# Patient Record
Sex: Male | Born: 1975 | Race: Black or African American | Hispanic: No | Smoking: Current every day smoker
Health system: Southern US, Community
[De-identification: ages and names within clinical notes are randomized; demographics above are authoritative.]

## PROBLEM LIST (undated history)

## (undated) DIAGNOSIS — K219 Gastro-esophageal reflux disease without esophagitis: Secondary | ICD-10-CM

## (undated) DIAGNOSIS — J40 Bronchitis, not specified as acute or chronic: Secondary | ICD-10-CM

## (undated) DIAGNOSIS — I639 Cerebral infarction, unspecified: Secondary | ICD-10-CM

## (undated) DIAGNOSIS — J45909 Unspecified asthma, uncomplicated: Secondary | ICD-10-CM

## (undated) HISTORY — DX: Gastro-esophageal reflux disease without esophagitis: K21.9

---

## 2006-01-17 ENCOUNTER — Emergency Department (HOSPITAL_COMMUNITY): Admission: EM | Admit: 2006-01-17 | Discharge: 2006-01-17 | Payer: Self-pay | Admitting: Emergency Medicine

## 2006-11-27 ENCOUNTER — Emergency Department (HOSPITAL_COMMUNITY): Admission: EM | Admit: 2006-11-27 | Discharge: 2006-11-28 | Payer: Self-pay | Admitting: *Deleted

## 2007-03-13 ENCOUNTER — Emergency Department (HOSPITAL_COMMUNITY): Admission: EM | Admit: 2007-03-13 | Discharge: 2007-03-13 | Payer: Self-pay | Admitting: Emergency Medicine

## 2010-06-22 ENCOUNTER — Emergency Department (HOSPITAL_COMMUNITY)
Admission: EM | Admit: 2010-06-22 | Discharge: 2010-06-23 | Payer: Self-pay | Source: Home / Self Care | Admitting: Emergency Medicine

## 2010-10-25 LAB — BASIC METABOLIC PANEL
BUN: 9 mg/dL (ref 6–23)
CO2: 25 mEq/L (ref 19–32)
Calcium: 9.2 mg/dL (ref 8.4–10.5)
GFR calc non Af Amer: >60 mL/min (ref >60)
Glucose, Bld: 106 mg/dL — ABNORMAL HIGH (ref 70–99)

## 2010-10-25 LAB — DIFFERENTIAL
Basophils Absolute: 0 10*3/uL (ref 0.0–0.1)
Basophils Relative: 1 % (ref 0–1)
Eosinophils Absolute: 0.6 10*3/uL (ref 0.0–0.7)
Monocytes Absolute: 0.4 10*3/uL (ref 0.1–1.0)
Monocytes Relative: 6 % (ref 3–12)
Neutro Abs: 1.7 10*3/uL (ref 1.7–7.7)
Neutrophils Relative %: 27 % — ABNORMAL LOW (ref 43–77)

## 2010-10-25 LAB — POCT CARDIAC MARKERS: Myoglobin, poc: 42.5 ng/mL (ref 12–200)

## 2010-10-25 LAB — CBC
MCH: 28.6 pg (ref 26.0–34.0)
MCHC: 32.8 g/dL (ref 30.0–36.0)
RDW: 13 % (ref 11.5–15.5)

## 2011-12-31 ENCOUNTER — Encounter (HOSPITAL_COMMUNITY): Payer: Self-pay | Admitting: Family Medicine

## 2011-12-31 ENCOUNTER — Emergency Department (HOSPITAL_COMMUNITY)
Admission: EM | Admit: 2011-12-31 | Discharge: 2011-12-31 | Disposition: A | Discharge status: Home / Self Care | Payer: BC Managed Care – PPO | Attending: Emergency Medicine | Admitting: Emergency Medicine

## 2011-12-31 DIAGNOSIS — J45901 Unspecified asthma with (acute) exacerbation: Secondary | ICD-10-CM

## 2011-12-31 DIAGNOSIS — F17200 Nicotine dependence, unspecified, uncomplicated: Secondary | ICD-10-CM | POA: Insufficient documentation

## 2011-12-31 DIAGNOSIS — R42 Dizziness and giddiness: Secondary | ICD-10-CM | POA: Insufficient documentation

## 2011-12-31 DIAGNOSIS — R0602 Shortness of breath: Secondary | ICD-10-CM | POA: Insufficient documentation

## 2011-12-31 HISTORY — DX: Bronchitis, not specified as acute or chronic: J40

## 2011-12-31 MED ORDER — PREDNISONE 20 MG PO TABS: 60.0000 mg | ORAL_TABLET | ORAL | Status: AC

## 2011-12-31 MED ORDER — ALBUTEROL SULFATE (5 MG/ML) 0.5% IN NEBU
2.5000 mg | INHALATION_SOLUTION | RESPIRATORY_TRACT | Status: AC
  Administered 2011-12-31: 2.5 mg via RESPIRATORY_TRACT
  Filled 2011-12-31: qty 0.5

## 2011-12-31 MED ORDER — PREDNISONE 20 MG PO TABS
60.0000 mg | ORAL_TABLET | ORAL | Status: AC
  Administered 2011-12-31: 60 mg via ORAL
  Filled 2011-12-31: qty 3

## 2011-12-31 MED ORDER — ALBUTEROL SULFATE HFA 108 (90 BASE) MCG/ACT IN AERS
2.0000 | INHALATION_SPRAY | Freq: Four times a day (QID) | RESPIRATORY_TRACT | Status: DC
  Administered 2011-12-31: 2 via RESPIRATORY_TRACT
  Filled 2011-12-31: qty 6.7
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
## 2011-12-31 NOTE — Discharge Instructions (Signed)

## 2011-12-31 NOTE — ED Provider Notes (Signed)
History  Scribed for Gerhard Munch, MD, the patient was seen in room STRE2/STRE2. This chart was scribed by Candelaria Stagers. The patient's care started at 1:29 PM    CSN: [id number]  Arrival date & time 12/31/11  1257   First MD Initiated Contact with Patient 12/31/11 1317      Chief Complaint  Patient presents with  . Shortness of Breath     Shortness of Breath This is a chronic problem. The current episode started yesterday. The problem occurs constantly. The problem has been gradually worsening. Pertinent negatives include no fever, leg swelling or vomiting. Risk factors include smoking. He has tried OTC cough suppressants for the symptoms. The treatment provided no relief.   Randy Campbell is a 36 y.o. male who presents to the Emergency Department complaining of SOB that started yesterday.  Pt has h/o asthma.  He has been experiencing sneezing and congestion.  He is also experiencing feelings of blacking out, and lightheadedness.  He denies confusion, fever, or disorientation.  He has taken nyquil for chest congestion and sinus medication with little relief.   Past Medical History  Diagnosis Date  . Bronchitis     History reviewed. No pertinent past surgical history.  History reviewed. No pertinent family history.  History  Substance Use Topics  . Smoking status: Current Everyday Smoker  . Smokeless tobacco: Not on file  . Alcohol Use: No      Review of Systems  Constitutional: Negative for fever and chills.  HENT: Positive for congestion and sneezing.   Respiratory: Negative for shortness of breath.   Cardiovascular: Negative for leg swelling.  Gastrointestinal: Negative for nausea and vomiting.  Neurological: Positive for light-headedness. Negative for weakness.    Allergies  Review of patient's allergies indicates no known allergies.  Home Medications   Current Outpatient Rx  Name Route Sig Dispense Refill  . DAYQUIL/NYQUIL COLD/FLU RELIEF PO Oral Take 2  capsules by mouth every 4 (four) hours as needed. For cough and cold    . PSEUDOEPHEDRINE HCL 30 MG PO TABS Oral Take 60 mg by mouth every 4 (four) hours as needed. For allergies      BP 133/81  Pulse 77  Temp(Src) 98 F (36.7 C) (Oral)  Resp 16  SpO2 96%  Physical Exam  Nursing note and vitals reviewed. Constitutional: He is oriented to person, place, and time. He appears well-developed and well-nourished. No distress.  HENT:  Head: Normocephalic and atraumatic.  Eyes: EOM are normal. Right eye exhibits no discharge. Left eye exhibits no discharge.  Neck: Neck supple. No tracheal deviation present. No thyromegaly present.  Cardiovascular: Normal rate.   Pulmonary/Chest: Effort normal. No respiratory distress. He has wheezes.  Abdominal: Soft. There is no tenderness. There is no rebound.  Musculoskeletal: Normal range of motion. He exhibits no edema.  Lymphadenopathy:    He has no cervical adenopathy.  Neurological: He is alert and oriented to person, place, and time.  Skin: Skin is warm and dry. No rash noted.  Psychiatric: He has a normal mood and affect. His behavior is normal.    ED Course  Procedures     COORDINATION OF CARE:    Labs Reviewed - No data to display No results found.   No diagnosis found.    MDM  I personally performed the services described in this documentation, which was scribed in my presence. The recorded information has been reviewed and considered.  This generally well-appearing male with history of any bronchitis or  asthma presents with ongoing cough, congestion, and exam wheezing.  Given the absence of distress the patient was stable for discharge following provision of inhaled albuterol, and initiation of a course of steroids.      Gerhard Munch, MD 12/31/11 1459

## 2012-01-07 ENCOUNTER — Encounter (HOSPITAL_COMMUNITY): Payer: Self-pay | Admitting: Family Medicine

## 2012-01-07 ENCOUNTER — Emergency Department (HOSPITAL_COMMUNITY)
Admission: EM | Admit: 2012-01-07 | Discharge: 2012-01-07 | Disposition: A | Discharge status: Home Health | Payer: BC Managed Care – PPO | Attending: Emergency Medicine | Admitting: Emergency Medicine

## 2012-01-07 ENCOUNTER — Emergency Department (HOSPITAL_COMMUNITY): Payer: BC Managed Care – PPO

## 2012-01-07 DIAGNOSIS — J189 Pneumonia, unspecified organism: Secondary | ICD-10-CM | POA: Insufficient documentation

## 2012-01-07 DIAGNOSIS — R05 Cough: Secondary | ICD-10-CM | POA: Insufficient documentation

## 2012-01-07 DIAGNOSIS — Z760 Encounter for issue of repeat prescription: Secondary | ICD-10-CM | POA: Insufficient documentation

## 2012-01-07 DIAGNOSIS — F17200 Nicotine dependence, unspecified, uncomplicated: Secondary | ICD-10-CM | POA: Insufficient documentation

## 2012-01-07 DIAGNOSIS — R062 Wheezing: Secondary | ICD-10-CM | POA: Insufficient documentation

## 2012-01-07 DIAGNOSIS — R059 Cough, unspecified: Secondary | ICD-10-CM | POA: Insufficient documentation

## 2012-01-07 MED ORDER — AZITHROMYCIN 250 MG PO TABS
500.0000 mg | ORAL_TABLET | Freq: Once | ORAL | Status: AC
  Administered 2012-01-07: 500 mg via ORAL
  Filled 2012-01-07: qty 2

## 2012-01-07 MED ORDER — ALBUTEROL SULFATE HFA 108 (90 BASE) MCG/ACT IN AERS: 2.0000 | INHALATION_SPRAY | RESPIRATORY_TRACT | Status: DC | PRN

## 2012-01-07 MED ORDER — AZITHROMYCIN 250 MG PO TABS: 250.0000 mg | ORAL_TABLET | Freq: Every day | ORAL | Status: AC

## 2012-01-07 NOTE — ED Notes (Signed)
Pt sts was here last Sunday and is out of meds. Albuterol and prednisone. sts started to feel better for a few days and then the meds ran out and not feeling well again.

## 2012-01-07 NOTE — Discharge Instructions (Signed)
You appear to have a very early pneumonia on the right side. We've given you your first dose of antibiotics here to treat this. You have been given a prescription for the remainder of the antibiotics. Start this tomorrow and take 1 pill a day until the pills are gone. Use the albuterol every 4 hours as needed for wheezing. If you have a high fever not controlled by medications, chest pain, worsening shortness of breath, or any other worrisome symptoms, please return to the emergency department. Otherwise, followup with your primary care Dr.  Sheila Oats GUIDE  Dental Problems  Patients with Medicaid: [facility] [phone] [address]                                           [phone] [address] Phone:  [phone]                                                  Phone:  [phone]  If unable to pay or uninsured, contact:  [facility] or [facility]. to become qualified for the adult dental clinic.  Chronic Pain Problems Contact [facility]  [phone] Patients need to be referred by their primary care doctor.  Insufficient Money for Medicine Contact [employer]:  call "211" or [facility] ([phone] 020-5360.  No Primary Care Doctor Call [facility]  [phone] Other agencies that provide inexpensive medical care    [facility]  [phone]    [facility]  [phone]    [facility]  [phone]    [facility]  [phone]    [facility]  [phone]    [facility]  [phone]  Psychological Services [facility]  [phone] [facility]  [phone] [facility]   [phone] (emergency services [phone])  Substance Abuse [facility]  [phone] [facility] 210-027-2726 [facility] [phone] Floyd[facility] Flock ([phone] Residential & Outpatient Substance Abuse Program   [phone]  Abuse/Neglect [facility] Child Abuse Hotline 913-627-4883 [facility] Child Abuse Hotline 276-522-5020 (After Hours)  Emergency Shelter [facility] [phone]  Maternity Homes [facility] 619-027-2184 [facility] [phone]  MRSA Hotline #:   772-448-5572    [facility] Resources  [facility]     [employer]                          [facility] Dept. [address] [place]                       [address]      [address]  Patrecia Pace  Michell Heinrich Phone:  [phone]                                   Phone:  [phone]                 Phone:  [phone]  [facility] Phone:  [phone]  [facility] Child Abuse Hotline ([phone] 419-2184 9033259954 (After Hours)  Pneumonia, Adult Pneumonia is an infection of the lungs.  CAUSES Pneumonia may be caused by bacteria or a virus. Usually, these infections are caused by breathing infectious particles into the lungs (respiratory tract). SYMPTOMS   Cough.   Fever.   Chest pain.   Increased rate of breathing.   Wheezing.   Mucus production.  DIAGNOSIS  If you have the common symptoms of pneumonia, your caregiver will typically confirm the diagnosis with a chest X-ray. The X-ray will show an abnormality in the lung (pulmonary infiltrate) if you have pneumonia. Other tests of your blood, urine, or sputum may be done to find the specific cause of your pneumonia. Your caregiver may also do tests (blood gases or pulse oximetry) to see how well your lungs are working. TREATMENT  Some forms of pneumonia may be spread to other people when you cough or sneeze. You may be asked to wear a mask before and during your exam. Pneumonia that is caused by bacteria is treated with antibiotic medicine. Pneumonia that  is caused by the influenza virus may be treated with an antiviral medicine. Most other viral infections must run their course. These infections will not respond to antibiotics.  PREVENTION A pneumococcal shot (vaccine) is available to prevent a common bacterial cause of pneumonia. This is usually suggested for:  People over 3 years old.   Patients on chemotherapy.   People with chronic lung problems, such as bronchitis or emphysema.   People with immune system problems.  If you are over 65 or have a high risk condition, you may receive the pneumococcal vaccine if you have not received it before. In some countries, a routine influenza vaccine is also recommended. This vaccine can help prevent some cases of pneumonia.You may be offered the influenza vaccine as part of your care. If you smoke, it is time to quit. You may receive instructions on how to stop smoking. Your caregiver can provide medicines and counseling to help you quit. HOME CARE INSTRUCTIONS   Cough suppressants may be used if you are losing too much rest. However, coughing protects you by clearing your lungs. You should avoid using cough suppressants if you can.   Your caregiver may have prescribed medicine if he or she thinks your pneumonia is caused by a bacteria or influenza. Finish your medicine even if you start to feel better.   Your caregiver may also prescribe an expectorant. This loosens the mucus to be coughed up.   Only take over-the-counter or prescription medicines for pain, discomfort, or fever as directed by your caregiver.   Do not smoke. Smoking is a common cause of bronchitis and can contribute to pneumonia. If you are a smoker and continue to smoke, your cough may last several weeks after your pneumonia has cleared.   A cold steam vaporizer or humidifier in your room or home may help loosen mucus.   Coughing is often worse at night. Sleeping in a semi-upright position in a recliner or using a couple  pillows under your head  will help with this.   Get rest as you feel it is needed. Your body will usually let you know when you need to rest.  SEEK IMMEDIATE MEDICAL CARE IF:   Your illness becomes worse. This is especially true if you are elderly or weakened from any other disease.   You cannot control your cough with suppressants and are losing sleep.   You begin coughing up blood.   You develop pain which is getting worse or is uncontrolled with medicines.   You have a fever.   Any of the symptoms which initially brought you in for treatment are getting worse rather than better.   You develop shortness of breath or chest pain.  MAKE SURE YOU:   Understand these instructions.   Will watch your condition.   Will get help right away if you are not doing well or get worse.  Document Released: 07/31/2005 Document Revised: 07/20/2011 Document Reviewed: 10/20/2010 [facility] Patient Information 2012 [place], [place].

## 2012-01-07 NOTE — ED Notes (Signed)
Discharge instructions reviewed with pt; verbalizes understanding.  No questions asked; No further c/o's voiced.  Pt ambulatory to lobby.  NAD noted. 

## 2012-01-07 NOTE — ED Provider Notes (Signed)
History     CSN: [id number]  Arrival date & time 01/07/12  1407   First MD Initiated Contact with Patient 01/07/12 1550      Chief Complaint  Patient presents with  . Medication Refill    (Consider location/radiation/quality/duration/timing/severity/associated sxs/prior treatment) HPI History from patient. 36 year old male who presents with complaint of medication refill. States he was seen here one week for a cough and wheezing. He was told he likely had bronchitis and was given a prescription for albuterol and prednisone. States he did not get this filled until a few days after being seen and has been taking this as prescribed since. States that his cough is not remarkably improved and he continues to to feel short of breath. Has been taking albuterol as prescribed. He denies fever or chills. States his cough has been productive in nature with clear sputum. Cough worsens at night. Denies sinus pressure or tenderness, and has not had much congestion with this. Denies nausea, vomiting, diarrhea, abdominal pain. He has not had any chest pain. He denies childhood history of asthma. He has smoked in the past, but denies current smoking.  Past Medical History  Diagnosis Date  . Bronchitis     History reviewed. No pertinent past surgical history.  History reviewed. No pertinent family history.  History  Substance Use Topics  . Smoking status: Current Everyday Smoker  . Smokeless tobacco: Not on file  . Alcohol Use: No      Review of Systems  Constitutional: Negative for fever, chills, activity change and appetite change.  HENT: Negative for congestion, sore throat, facial swelling, rhinorrhea, trouble swallowing, neck pain, neck stiffness and sinus pressure.   Eyes: Negative.   Respiratory: Positive for cough, shortness of breath and wheezing. Negative for chest tightness.   Cardiovascular: Negative for chest pain, palpitations and leg swelling.  Gastrointestinal: Negative for  nausea, vomiting and abdominal pain.  Musculoskeletal: Negative for myalgias.  Skin: Negative for color change and rash.  Neurological: Negative for dizziness, weakness and numbness.    Allergies  Review of patient's allergies indicates no known allergies.  Home Medications   Current Outpatient Rx  Name Route Sig Dispense Refill  . ALBUTEROL SULFATE HFA 108 (90 BASE) MCG/ACT IN AERS Inhalation Inhale 2 puffs into the lungs every 6 (six) hours as needed. Shortness of breath    . PREDNISONE 20 MG PO TABS Oral Take 60 mg by mouth daily. For 4 days      BP 116/70  Pulse 62  Temp(Src) 98.1 F (36.7 C) (Oral)  Resp 20  SpO2 96%  Physical Exam  Nursing note and vitals reviewed. Constitutional: He appears well-developed and well-nourished. No distress.  HENT:  Head: Normocephalic and atraumatic.  Right Ear: External ear normal.  Left Ear: External ear normal.  Mouth/Throat: Oropharynx is clear and moist. No oropharyngeal exudate.       Sinuses NT to palp  Eyes: EOM are normal. Pupils are equal, round, and reactive to light.  Neck: Normal range of motion. Neck supple.  Cardiovascular: Normal rate, regular rhythm and normal heart sounds.   Pulmonary/Chest: Effort normal and breath sounds normal. He exhibits no tenderness.       Scattered rhonchi and expiratory wheezes Reduced breath sounds at R base  Abdominal: Soft. There is no tenderness.  Musculoskeletal: Normal range of motion.  Lymphadenopathy:    He has no cervical adenopathy.  Neurological: He is alert.  Skin: Skin is warm and dry. He is not diaphoretic.  Psychiatric: He has a normal mood and affect.    ED Course  Procedures (including critical care time)  Labs Reviewed - No data to display Dg Chest 2 View  01/07/2012  *RADIOLOGY REPORT*  Clinical Data: Wheezing.  Diminished breath sounds.  Cough. Central chest pain.  Cough, congestion.  CHEST - 2 VIEW  Comparison: 06/22/2010  Findings: Heart size is normal.  There  is mild perihilar bronchitic change.  Prominence of interstitial markings is noted.  There is mild density at the right lung base which may represent early infiltrate.  There are no pleural effusions. Visualized osseous structures have a normal appearance.  IMPRESSION:  1.  Bronchitic change. 2.  Question early right lower lobe infiltrate.  Original Report Authenticated By: Patterson Hammersmith, M.D.     1. Community acquired pneumonia       MDM  Patient presents with continued wheezing and subjective shortness of breath after being seen last week and diagnosed with likely bronchitis. Has been taking medication as prescribed. His vital signs are stable; he is not hypoxic, tachypneic or tachycardic. On exam, he is noted to have reduced breath sounds at the right base. X-ray, which I personally reviewed, shows questionable right lower lobe infiltrate - CAP. Will treat with Zithromax. Patient instructed to continue albuterol, and refill given for the same. He is in the process of establishing care with a PCP and has an appointment for this Wednesday, which he was instructed to keep. Reasons to return to the emergency department sooner discussed. He verbalized understanding and agreed to plan.        Grant Fontana, [place] 01/07/12 2042

## 2012-01-10 NOTE — ED Provider Notes (Signed)
Medical screening examination/treatment/procedure(s) were performed by non-physician practitioner and as supervising physician I was immediately available for consultation/collaboration.   Lyanne Co, MD 01/10/12 [phone]

## 2012-01-31 ENCOUNTER — Telehealth: Payer: Self-pay | Admitting: Internal Medicine

## 2012-01-31 NOTE — Telephone Encounter (Signed)
This patient was seen In ER for asthma and advised to follow pulmonary. Note was sent to me because I was on call. Please give first avail to any doc new consult for asthma

## 2012-02-29 ENCOUNTER — Encounter: Payer: Self-pay | Admitting: Internal Medicine

## 2012-02-29 NOTE — Telephone Encounter (Signed)
Attempted to reach pt several times at number listed.  Unable to reach the pt.  [employer] message stated that "the wireless customer is not available".  Sent a letter to pt.  Randy Campbell

## 2012-05-02 ENCOUNTER — Emergency Department (HOSPITAL_COMMUNITY)
Admission: EM | Admit: 2012-05-02 | Discharge: 2012-05-02 | Disposition: A | Discharge status: Home / Self Care | Payer: BC Managed Care – PPO | Attending: Emergency Medicine | Admitting: Emergency Medicine

## 2012-05-02 ENCOUNTER — Emergency Department (HOSPITAL_COMMUNITY): Payer: BC Managed Care – PPO

## 2012-05-02 DIAGNOSIS — R062 Wheezing: Secondary | ICD-10-CM | POA: Insufficient documentation

## 2012-05-02 DIAGNOSIS — R059 Cough, unspecified: Secondary | ICD-10-CM | POA: Insufficient documentation

## 2012-05-02 DIAGNOSIS — R05 Cough: Secondary | ICD-10-CM | POA: Insufficient documentation

## 2012-05-02 DIAGNOSIS — F17200 Nicotine dependence, unspecified, uncomplicated: Secondary | ICD-10-CM | POA: Insufficient documentation

## 2012-05-02 MED ORDER — PREDNISONE 20 MG PO TABS
60.0000 mg | ORAL_TABLET | Freq: Once | ORAL | Status: AC
  Administered 2012-05-02: 60 mg via ORAL
  Filled 2012-05-02: qty 3

## 2012-05-02 MED ORDER — IPRATROPIUM BROMIDE 0.02 % IN SOLN
0.5000 mg | Freq: Once | RESPIRATORY_TRACT | Status: AC
  Administered 2012-05-02: 0.5 mg via RESPIRATORY_TRACT
  Filled 2012-05-02: qty 2.5

## 2012-05-02 MED ORDER — ALBUTEROL SULFATE (5 MG/ML) 0.5% IN NEBU
5.0000 mg | INHALATION_SOLUTION | Freq: Once | RESPIRATORY_TRACT | Status: AC
  Administered 2012-05-02: 5 mg via RESPIRATORY_TRACT
  Filled 2012-05-02: qty 1

## 2012-05-02 MED ORDER — ALBUTEROL SULFATE HFA 108 (90 BASE) MCG/ACT IN AERS: 2.0000 | INHALATION_SPRAY | RESPIRATORY_TRACT | Status: DC | PRN

## 2012-05-02 MED ORDER — PREDNISONE 20 MG PO TABS: 60.0000 mg | ORAL_TABLET | Freq: Every day | ORAL | Status: DC

## 2012-05-02 NOTE — ED Notes (Signed)
Cp started on Monday w/ lots of wheezing last night  Sob since Tuesday  W/ exertion has had coughing alot

## 2012-05-02 NOTE — ED Provider Notes (Signed)
History     CSN: [id number]  Arrival date & time 05/02/12  4098   First MD Initiated Contact with Patient 05/02/12 0902      Chief Complaint  Patient presents with  . Shortness of Breath    (Consider location/radiation/quality/duration/timing/severity/associated sxs/prior treatment) Patient is a 36 y.o. male presenting with shortness of breath. The history is provided by the patient.  Shortness of Breath  Associated symptoms include cough, shortness of breath and wheezing. Pertinent negatives include no chest pain and no fever.  pt notes hx bronchitis/wheezing w uri symptoms, presents w non productive cough x past few days. Episodic. No specific exacerbating or alleviating factors. w coughing spell, transient mid chest soreness, no other cp or discomfort, even w activity/exertion. No sore throat, runny nose, or other uri c/o. No fever/chills. No leg pain or swelling. Smoker. Denies hx asthma.      Past Medical History  Diagnosis Date  . Bronchitis     No past surgical history on file.  No family history on file.  History  Substance Use Topics  . Smoking status: Current Every Day Smoker  . Smokeless tobacco: Not on file  . Alcohol Use: No      Review of Systems  Constitutional: Negative for fever.  HENT: Negative for neck pain.   Eyes: Negative for redness.  Respiratory: Positive for cough, shortness of breath and wheezing.   Cardiovascular: Negative for chest pain and leg swelling.  Gastrointestinal: Negative for abdominal pain.  Genitourinary: Negative for flank pain.  Musculoskeletal: Negative for back pain.  Skin: Negative for rash.  Neurological: Negative for headaches.  Hematological: Does not bruise/bleed easily.  Psychiatric/Behavioral: Negative for confusion.    Allergies  Review of patient's allergies indicates no known allergies.  Home Medications   Current Outpatient Rx  Name Route Sig Dispense Refill  . ALBUTEROL SULFATE HFA 108 (90 BASE)  MCG/ACT IN AERS Inhalation Inhale 2 puffs into the lungs every 6 (six) hours as needed.    . CHEST CONGESTION RELIEF PO Oral Take by mouth.      BP 136/89  Pulse 59  Temp 97.9 F (36.6 C)  Resp 16  SpO2 99%  Physical Exam  Nursing note and vitals reviewed. Constitutional: He is oriented to person, place, and time. He appears well-developed and well-nourished. No distress.  HENT:  Head: Atraumatic.  Eyes: Pupils are equal, round, and reactive to light.  Neck: Neck supple. No tracheal deviation present.  Cardiovascular: Normal rate, regular rhythm, normal heart sounds and intact distal pulses.  Exam reveals no gallop and no friction rub.   No murmur heard. Pulmonary/Chest: Effort normal. No accessory muscle usage. No respiratory distress. He has wheezes. He exhibits tenderness.  Abdominal: Soft. He exhibits no distension. There is no tenderness.  Musculoskeletal: Normal range of motion. He exhibits no edema and no tenderness.  Neurological: He is alert and oriented to person, place, and time.  Skin: Skin is warm and dry.  Psychiatric: He has a normal mood and affect.    ED Course  Procedures (including critical care time)  Dg Chest 2 View  05/02/2012  *RADIOLOGY REPORT*  Clinical Data: Cough, congestion.  CHEST - 2 VIEW  Comparison: 01/07/2012, 06/22/2010  Findings: Peribronchial thickening. Mild patchy opacities within the lung bases and middle lobe, similar to prior studies dating back to 2011. No pleural effusion.  No pneumothorax.  No acute osseous finding.  IMPRESSION: Peribronchial thickening can be seen with reactive airway disease or bronchiolitis.  Mild  bibasilar opacities are nonspecific with differential including atelectasis, scarring, or pneumonia.  The appearance is similar to priors dating back to 2011, which therefore favors scarring.  Recommend radiograph follow-up when the patient is asymptomatic to have a baseline.   Original Report Authenticated By: Waneta Martins, M.D.        MDM  Albuterol and atrovent neb. pred po. Cxr.    Reviewed nursing notes and prior charts for additional history.    cxr unchanged from prior. No fevers. Occasional non productive cough in ed.  Recheck mild wheezing persists.  pred po, albuterol and atrovent neb.   No rales, no fevers. Pt improved. Good air exchange. Suspect viral uri w assoc bronchospasm/wheezing.   Smoking cessation. Brief course prednisone. Albuterol.       Suzi Roots, MD 05/02/12 [phone]

## 2012-05-02 NOTE — ED Notes (Signed)
Discharge instructions reviewed. Pt verbalized understanding.  

## 2012-05-02 NOTE — ED Notes (Signed)
Pt to xray via wc

## 2012-07-19 ENCOUNTER — Encounter (HOSPITAL_COMMUNITY): Payer: Self-pay

## 2012-07-19 ENCOUNTER — Emergency Department (HOSPITAL_COMMUNITY)
Admission: EM | Admit: 2012-07-19 | Discharge: 2012-07-20 | Disposition: A | Discharge status: Home / Self Care | Payer: BC Managed Care – PPO | Attending: Emergency Medicine | Admitting: Emergency Medicine

## 2012-07-19 ENCOUNTER — Emergency Department (HOSPITAL_COMMUNITY): Payer: BC Managed Care – PPO

## 2012-07-19 DIAGNOSIS — Z8709 Personal history of other diseases of the respiratory system: Secondary | ICD-10-CM | POA: Insufficient documentation

## 2012-07-19 DIAGNOSIS — Z79899 Other long term (current) drug therapy: Secondary | ICD-10-CM | POA: Insufficient documentation

## 2012-07-19 DIAGNOSIS — R404 Transient alteration of awareness: Secondary | ICD-10-CM | POA: Insufficient documentation

## 2012-07-19 DIAGNOSIS — F17200 Nicotine dependence, unspecified, uncomplicated: Secondary | ICD-10-CM | POA: Insufficient documentation

## 2012-07-19 DIAGNOSIS — S134XXA Sprain of ligaments of cervical spine, initial encounter: Secondary | ICD-10-CM

## 2012-07-19 DIAGNOSIS — G44309 Post-traumatic headache, unspecified, not intractable: Secondary | ICD-10-CM | POA: Insufficient documentation

## 2012-07-19 DIAGNOSIS — S139XXA Sprain of joints and ligaments of unspecified parts of neck, initial encounter: Secondary | ICD-10-CM | POA: Insufficient documentation

## 2012-07-19 DIAGNOSIS — Y92410 Unspecified street and highway as the place of occurrence of the external cause: Secondary | ICD-10-CM | POA: Insufficient documentation

## 2012-07-19 DIAGNOSIS — R51 Headache: Secondary | ICD-10-CM

## 2012-07-19 DIAGNOSIS — Y9389 Activity, other specified: Secondary | ICD-10-CM | POA: Insufficient documentation

## 2012-07-19 MED ORDER — IBUPROFEN 800 MG PO TABS
800.0000 mg | ORAL_TABLET | Freq: Once | ORAL | Status: AC
  Administered 2012-07-19: 800 mg via ORAL
  Filled 2012-07-19: qty 1

## 2012-07-19 MED ORDER — CYCLOBENZAPRINE HCL 10 MG PO TABS
10.0000 mg | ORAL_TABLET | Freq: Once | ORAL | Status: AC
  Administered 2012-07-19: 10 mg via ORAL
  Filled 2012-07-19: qty 1

## 2012-07-19 NOTE — ED Notes (Signed)
Patient transported to CT 
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
## 2012-07-20 MED ORDER — CYCLOBENZAPRINE HCL 10 MG PO TABS: 10.0000 mg | ORAL_TABLET | Freq: Three times a day (TID) | ORAL | Status: DC | PRN

## 2012-07-20 MED ORDER — IBUPROFEN 800 MG PO TABS: 800.0000 mg | ORAL_TABLET | Freq: Three times a day (TID) | ORAL | Status: DC | PRN
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
## 2013-04-26 ENCOUNTER — Encounter (HOSPITAL_COMMUNITY): Payer: Self-pay | Admitting: Emergency Medicine

## 2013-04-26 DIAGNOSIS — J9801 Acute bronchospasm: Secondary | ICD-10-CM | POA: Insufficient documentation

## 2013-04-26 DIAGNOSIS — F17200 Nicotine dependence, unspecified, uncomplicated: Secondary | ICD-10-CM | POA: Insufficient documentation

## 2013-04-26 DIAGNOSIS — J45901 Unspecified asthma with (acute) exacerbation: Secondary | ICD-10-CM | POA: Insufficient documentation

## 2013-04-26 DIAGNOSIS — J069 Acute upper respiratory infection, unspecified: Secondary | ICD-10-CM | POA: Insufficient documentation

## 2013-04-26 DIAGNOSIS — Z8673 Personal history of transient ischemic attack (TIA), and cerebral infarction without residual deficits: Secondary | ICD-10-CM | POA: Insufficient documentation

## 2013-04-26 NOTE — ED Notes (Signed)
Pt. reports SOB with occasional dry cough / chest congestion and right fingers pain onset this afternoon .

## 2013-04-27 ENCOUNTER — Emergency Department (HOSPITAL_COMMUNITY)
Admit: 2013-04-27 | Discharge: 2013-04-27 | Disposition: A | Discharge status: Home / Self Care | Payer: Self-pay | Attending: Emergency Medicine | Admitting: Emergency Medicine

## 2013-04-27 ENCOUNTER — Emergency Department (HOSPITAL_COMMUNITY)
Admission: EM | Admit: 2013-04-27 | Discharge: 2013-04-27 | Disposition: A | Discharge status: Home / Self Care | Payer: BC Managed Care – PPO | Attending: Emergency Medicine | Admitting: Emergency Medicine

## 2013-04-27 DIAGNOSIS — J069 Acute upper respiratory infection, unspecified: Secondary | ICD-10-CM

## 2013-04-27 DIAGNOSIS — J9801 Acute bronchospasm: Secondary | ICD-10-CM

## 2013-04-27 DIAGNOSIS — Z720 Tobacco use: Secondary | ICD-10-CM

## 2013-04-27 HISTORY — DX: Cerebral infarction, unspecified: I63.9

## 2013-04-27 HISTORY — DX: Unspecified asthma, uncomplicated: J45.909

## 2013-04-27 MED ORDER — ALBUTEROL SULFATE HFA 108 (90 BASE) MCG/ACT IN AERS
2.0000 | INHALATION_SPRAY | Freq: Once | RESPIRATORY_TRACT | Status: AC
  Administered 2013-04-27: 2 via RESPIRATORY_TRACT
  Filled 2013-04-27: qty 6.7

## 2013-04-27 NOTE — ED Provider Notes (Signed)
CSN: [id number]     Arrival date & time 04/26/13  2324 History   First MD Initiated Contact with Patient 04/27/13 0040     Chief Complaint  Patient presents with  . Shortness of Breath  . Cough   (Consider location/radiation/quality/duration/timing/severity/associated sxs/prior Treatment) HPI Patient is a 37 yo man who says he has seasonal bronchospasm.   He presents with complaints of cough x 1 day. He says, "it is one of those trying to clear my throat kind of coughs".  The patient says he was starting to feel like he MIGHT have nasal congestion earlier this evening so, he took at OTC allergy medication.  Patient says he feels like has been wheezing - although not currently. Denies chest pain and fever. No history of hospitalizations for pulmonary related issues. No history of CAD or cardiomyopathy.   Past Medical History  Diagnosis Date  . Bronchitis   . Asthma   . Stroke    History reviewed. No pertinent past surgical history. No family history on file. History  Substance Use Topics  . Smoking status: Current Every Day Smoker  . Smokeless tobacco: Not on file  . Alcohol Use: No    Review of Systems 10 point ROS is obtained and is negative except as noted above. Patient notes that he sometimes has trouble with reading comprehension secondary to remote CVA.   Allergies  Review of patient's allergies indicates no known allergies.  Home Medications   Current Outpatient Rx  Name  Route  Sig  Dispense  Refill  . OVER THE COUNTER MEDICATION   Oral   Take 1 tablet by mouth daily as needed (allergies). Multi-symptom allergy medication          BP 116/72  Pulse 55  Temp(Src) 98 F (36.7 C) (Oral)  Resp 20  Wt 248 lb (112.492 kg)  SpO2 98% Physical Exam Gen: well developed and well nourished appearing Head: NCAT Eyes: PERL, EOMI Nose: no epistaixis or rhinorrhea Mouth/throat: mucosa is moist and pink Neck: supple, no stridor Lungs: CTA B, no wheezing, rhonchi or  rales, respiratory rate 16-20 minutes., No accessory muscle use or retractions CV: Regular rate and rhythm, no murmur, extremities appear well perfused Abd: soft, notender, nondistended Back: no ttp, no cva ttp Skin: Warm and dry Extremities: No edema or tenderness Neuro: CN ii-xii grossly intact, no focal deficits Psyche; normal affect,  calm and cooperative.   ED Course  Procedures (including critical care time) Imaging Review Dg Chest 2 View  04/27/2013   CLINICAL DATA:  Shortness of breath, cough.  EXAM: CHEST  2 VIEW  COMPARISON:  05/02/2012  FINDINGS: The heart size and mediastinal contours are within normal limits. Both lungs are clear. The visualized skeletal structures are unremarkable.  IMPRESSION: No active cardiopulmonary disease.   Electronically Signed   By: Charlett Nose M.D.   On: 04/27/2013 00:12    MDM  Patient with what sounds like early bronchitis. He does not have any abnormalities on CXR. We have ruled out PTX, pleural effusion, pneumonia. We will tx with Albuterol MDI. First dose in ED, and cough suppressant. Patient to f/u with PCP - referral given. Advised to return to the ED for fever, worsening sx, red flag sx.     Brandt Loosen, MD 04/27/13 [phone]

## 2013-04-27 NOTE — ED Notes (Signed)
Pt states that he has been SOB since 4pm and his right arm has been feeling numb and locks up since 6pm. 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 He also states he had a stroke a year ago and has had some weakness on his right side since then.

## 2014-05-03 IMAGING — CR DG CHEST 2V
2 series · 2 of 2 positions shown · non-contrast
Comparison: 05/02/2012

CLINICAL DATA: Shortness of breath, cough.

EXAM:
CHEST  2 VIEW

[w chest pa]
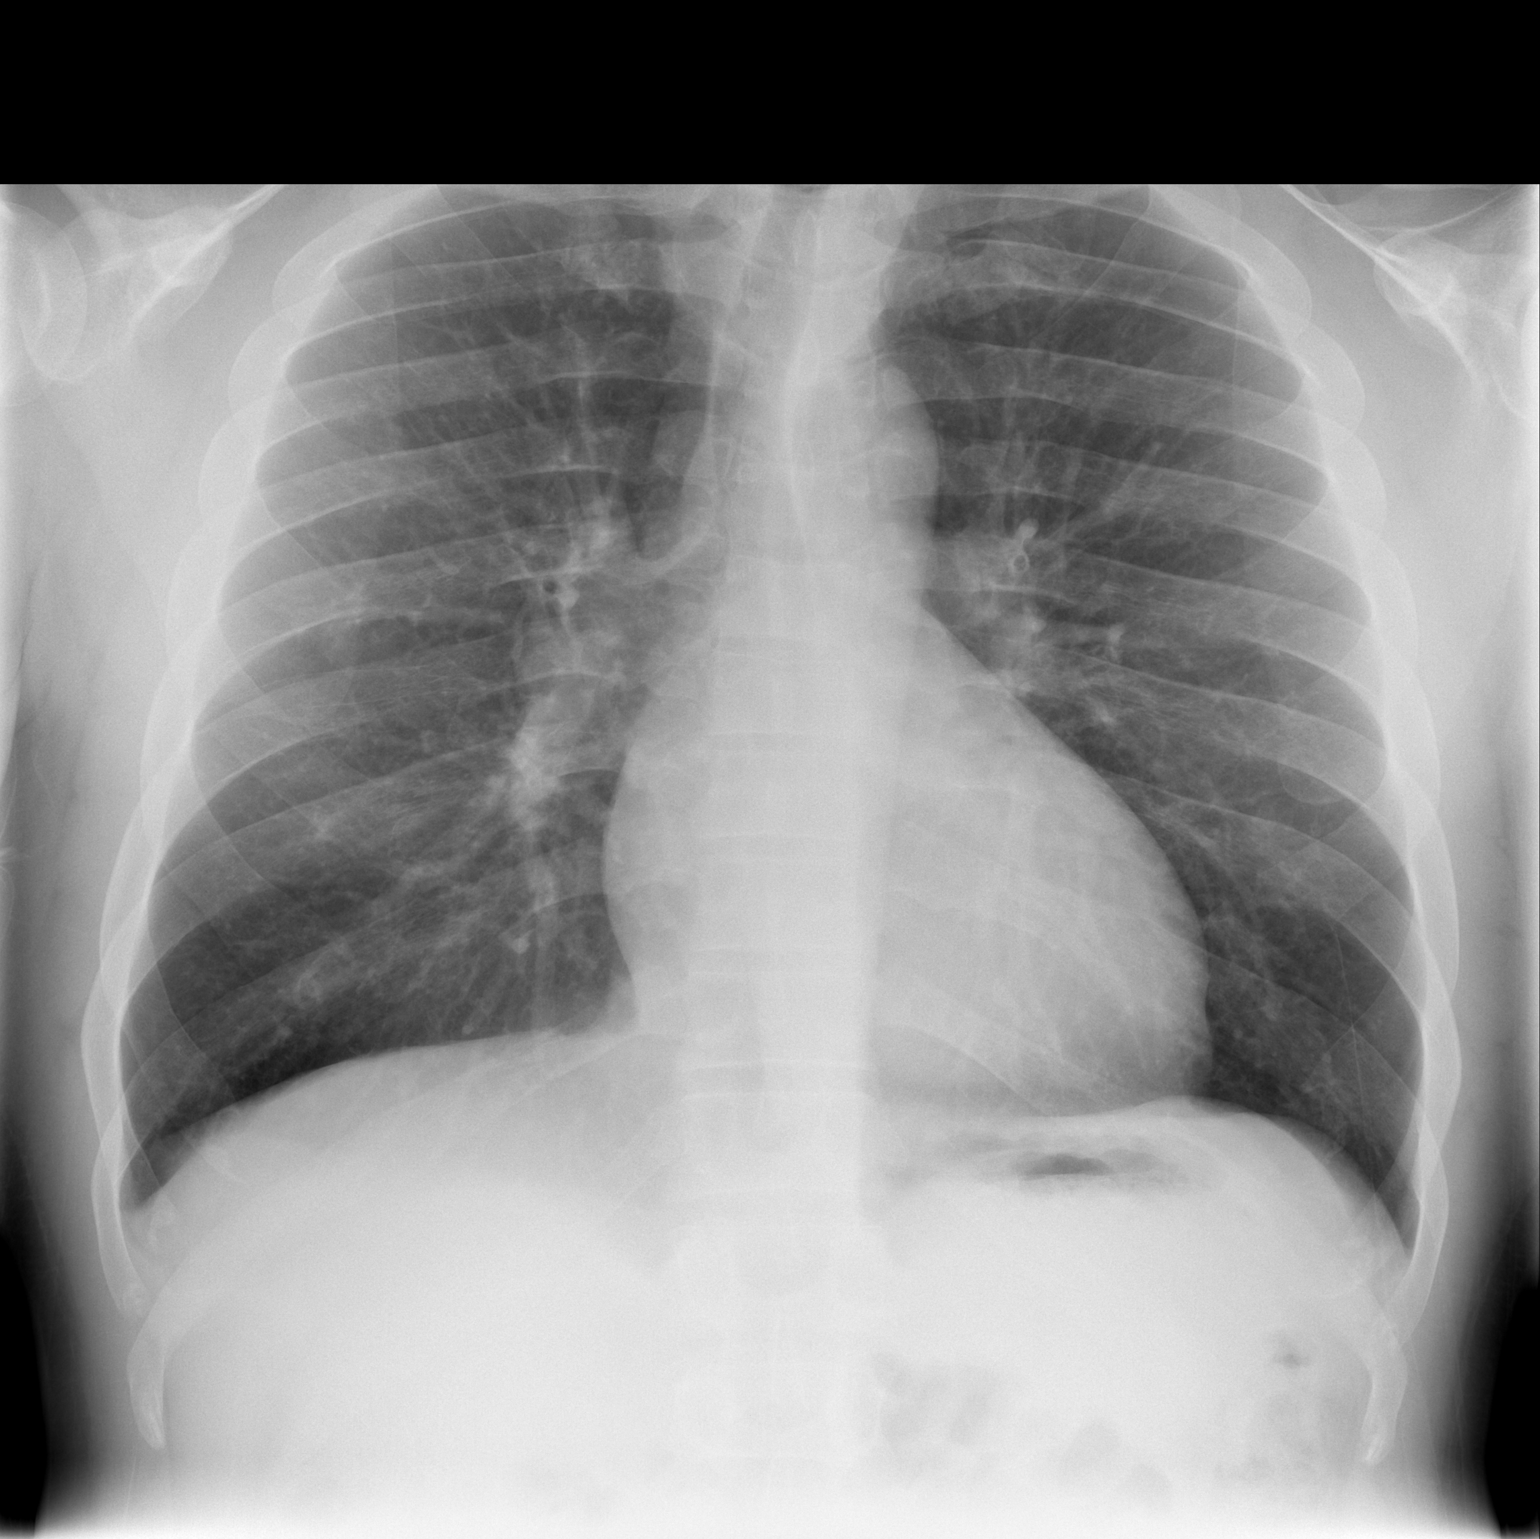

[w chest lat]
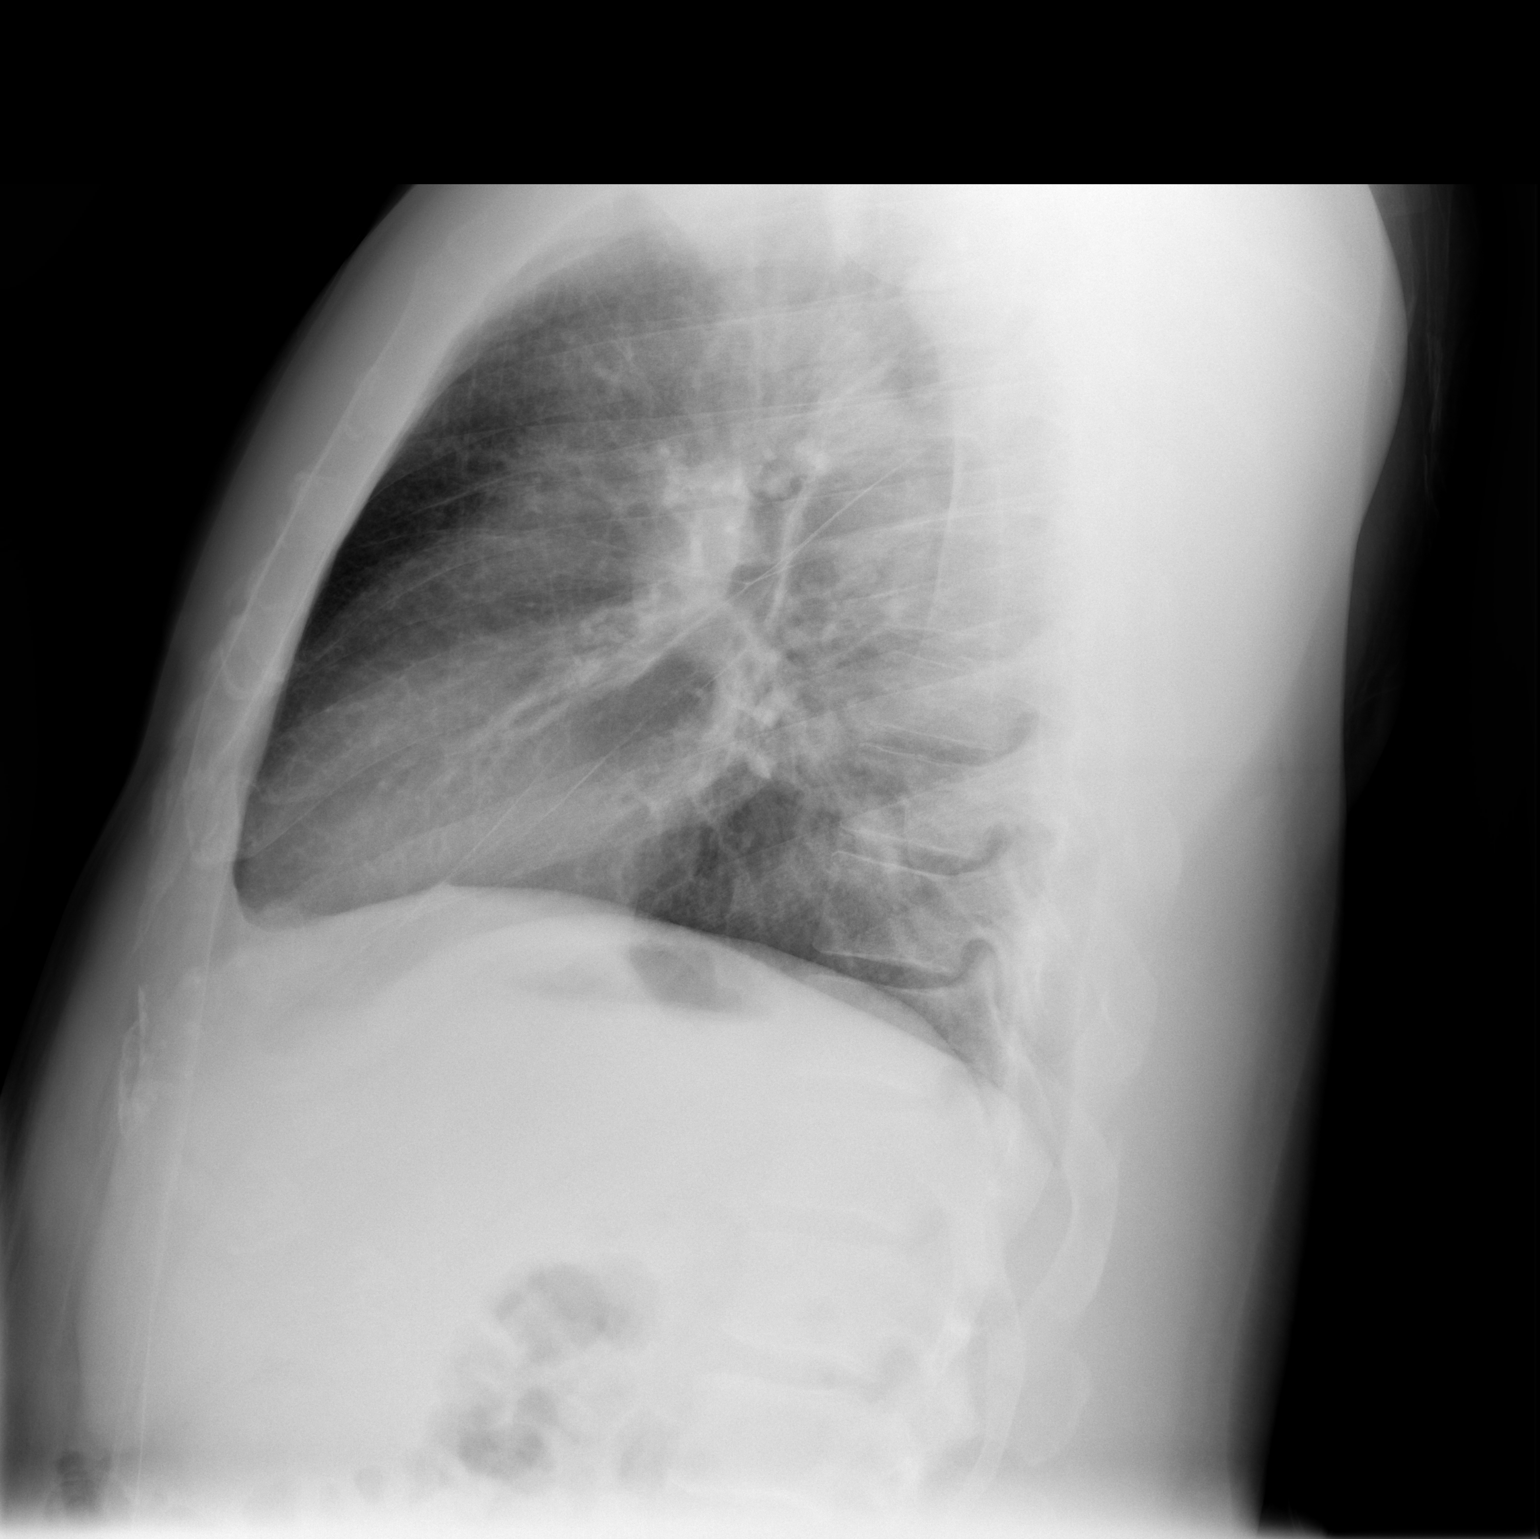

[2 of 2 positions shown; findings below may reference images not displayed]

FINDINGS: The heart size and mediastinal contours are within normal limits.
Both lungs are clear. The visualized skeletal structures are
unremarkable.
IMPRESSION: No active cardiopulmonary disease.

## 2018-06-20 ENCOUNTER — Encounter (HOSPITAL_COMMUNITY): Payer: Self-pay | Admitting: Emergency Medicine

## 2018-06-20 ENCOUNTER — Ambulatory Visit (HOSPITAL_COMMUNITY)
Admission: EM | Admit: 2018-06-20 | Discharge: 2018-06-20 | Disposition: A | Discharge status: Home / Self Care | Payer: BLUE CROSS/BLUE SHIELD | Attending: Family Medicine | Admitting: Family Medicine

## 2018-06-20 DIAGNOSIS — R1010 Upper abdominal pain, unspecified: Secondary | ICD-10-CM | POA: Diagnosis not present

## 2018-06-20 DIAGNOSIS — R1013 Epigastric pain: Secondary | ICD-10-CM

## 2018-06-20 MED ORDER — SUCRALFATE 1 G PO TABS: 1.0000 g | ORAL_TABLET | Freq: Three times a day (TID) | ORAL | 0 refills | Status: AC

## 2018-06-20 MED ORDER — ESOMEPRAZOLE MAGNESIUM 40 MG PO CPDR: 40.0000 mg | DELAYED_RELEASE_CAPSULE | Freq: Every day | ORAL | 1 refills | Status: AC

## 2018-06-20 NOTE — ED Triage Notes (Signed)
Pt c/o upper abdominal pain for several months, states eating has an effect on the pain, states sometimes drinking water helps. Was seen at [facility] before the summer and dx with gastritis.

## 2018-06-20 NOTE — ED Provider Notes (Signed)
[facility]   [id number] 06/20/18 Arrival Time: 1926  ASSESSMENT & PLAN:  1. Pain of upper abdomen   2. Dyspepsia   Possible ulcer. No need for urgent GI evaluation at this time.  Meds ordered this encounter  Medications  . esomeprazole (NEXIUM) 40 MG capsule    Sig: Take 1 capsule (40 mg total) by mouth daily.    Dispense:  30 capsule    Refill:  1  . sucralfate (CARAFATE) 1 g tablet    Sig: Take 1 tablet (1 g total) by mouth 4 (four) times daily -  with meals and at bedtime.    Dispense:  28 tablet    Refill:  0   ED if abrupt worsening. Discussed. Adhere to simple, bland diet. Initiate empiric trial of acid suppression; see orders.  Work note given.  If not seeing improvement over the next several days he may need to see GI to discuss endoscopy.  Reviewed expectations re: course of current medical issues. Questions answered. Outlined signs and symptoms indicating need for more acute intervention. Patient verbalized understanding. After Visit Summary given.   SUBJECTIVE:  Randy Campbell is a 42 y.o. male who presents with complaint of intermittent abdominal discomfort. Onset gradual, over the past few weeks. Location: upper mid abdomen without radiation. Described as burning and dull. Typically lasts several minutes to less than one hour with gradual resolution. Worse with eating. Symptoms, when present, are unchanged since beginning. Has been daily over the past few days. Does not wake him from sleep. Aggravating factors: coffee and eating. Alleviating factors: none reported. Associated symptoms: occasional belching. He denies chills, constipation, diarrhea, fever, nausea, sweats and vomiting. Appetite: decreased. PO intake: decreased. Ambulatory without assistance. Urinary symptoms: none. Last bowel movement yesterday without blood. OTC treatment: none. His wife reports that he has been on "acid medicines" in the past. None recently.  History reviewed. No pertinent  surgical history.  ROS: As per HPI. All other systems negative.  OBJECTIVE:  Vitals:   06/20/18 1937 06/20/18 1938  BP:  99/66  Pulse: 69   Resp: 16   Temp: 97.9 F (36.6 C)   SpO2: 99%     General appearance: alert; no distress Lungs: clear to auscultation bilaterally; unlabored respirations Heart: regular rate and rhythm Abdomen: soft; non-distended; mild tenderness over mid upper abdomen; bowel sounds present; no masses or organomegaly; no guarding or rebound tenderness Back: no CVA tenderness; FROM at hips Extremities: no edema; symmetrical with no gross deformities Skin: warm and dry Neurologic: normal gait Psychological: alert and cooperative; normal mood and affect   No Known Allergies                                             Past Medical History:  Diagnosis Date  . Asthma   . Bronchitis   . Stroke [facility])    Social History   Socioeconomic History  . Marital status: Married    Spouse name: Not on file  . Number of children: Not on file  . Years of education: Not on file  . Highest education level: Not on file  Occupational History  . Not on file  Social Needs  . Financial resource strain: Not on file  . Food insecurity:    Worry: Not on file    Inability: Not on file  . Transportation needs:  Medical: Not on file    Non-medical: Not on file  Tobacco Use  . Smoking status: Current Every Day Smoker  Substance and Sexual Activity  . Alcohol use: No  . Drug use: Not on file  . Sexual activity: Not on file  Lifestyle  . Physical activity:    Days per week: Not on file    Minutes per session: Not on file  . Stress: Not on file  Relationships  . Social connections:    Talks on phone: Not on file    Gets together: Not on file    Attends religious service: Not on file    Active member of club or organization: Not on file    Attends meetings of clubs or organizations: Not on file    Relationship status: Not on file  . Intimate partner violence:      Fear of current or ex partner: Not on file    Emotionally abused: Not on file    Physically abused: Not on file    Forced sexual activity: Not on file  Other Topics Concern  . Not on file  Social History Narrative  . Not on file   FH: No colon disease known.   Mardella Layman, MD 06/22/18 1008

## 2018-11-16 ENCOUNTER — Other Ambulatory Visit: Payer: Self-pay

## 2018-11-16 ENCOUNTER — Emergency Department (HOSPITAL_COMMUNITY): Payer: BLUE CROSS/BLUE SHIELD

## 2018-11-16 ENCOUNTER — Encounter (HOSPITAL_COMMUNITY): Payer: Self-pay

## 2018-11-16 ENCOUNTER — Emergency Department (HOSPITAL_COMMUNITY)
Admission: EM | Admit: 2018-11-16 | Discharge: 2018-11-17 | Disposition: A | Discharge status: Home / Self Care | Payer: BLUE CROSS/BLUE SHIELD | Attending: Emergency Medicine | Admitting: Emergency Medicine

## 2018-11-16 DIAGNOSIS — F17210 Nicotine dependence, cigarettes, uncomplicated: Secondary | ICD-10-CM | POA: Diagnosis not present

## 2018-11-16 DIAGNOSIS — R0602 Shortness of breath: Secondary | ICD-10-CM | POA: Diagnosis not present

## 2018-11-16 LAB — CBC WITH DIFFERENTIAL/PLATELET
Abs Immature Granulocytes: 0.01 10*3/uL (ref 0.00–0.07)
Basophils Absolute: 0.1 10*3/uL (ref 0.0–0.1)
Basophils Relative: 1 %
Eosinophils Absolute: 0.4 10*3/uL (ref 0.0–0.5)
Eosinophils Relative: 6 %
HCT: 39.7 % (ref 39.0–52.0)
Hemoglobin: 12.5 g/dL — ABNORMAL LOW (ref 13.0–17.0)
Immature Granulocytes: 0 %
Lymphocytes Relative: 30 %
Lymphs Abs: 2 10*3/uL (ref 0.7–4.0)
MCH: 28.6 pg (ref 26.0–34.0)
MCHC: 31.5 g/dL (ref 30.0–36.0)
MCV: 90.8 fL (ref 80.0–100.0)
Monocytes Absolute: 0.6 10*3/uL (ref 0.1–1.0)
Monocytes Relative: 8 %
Neutro Abs: 3.7 10*3/uL (ref 1.7–7.7)
Neutrophils Relative %: 55 %
Platelets: 279 10*3/uL (ref 150–400)
RBC: 4.37 MIL/uL (ref 4.22–5.81)
RDW: 13.3 % (ref 11.5–15.5)
WBC: 6.7 10*3/uL (ref 4.0–10.5)
nRBC: 0 % (ref 0.0–0.2)

## 2018-11-16 LAB — COMPREHENSIVE METABOLIC PANEL
ALT: 16 U/L (ref 0–44)
AST: 30 U/L (ref 15–41)
Albumin: 4 g/dL (ref 3.5–5.0)
Alkaline Phosphatase: 58 U/L (ref 38–126)
Anion gap: 9 (ref 5–15)
BUN: 6 mg/dL (ref 6–20)
CO2: 24 mmol/L (ref 22–32)
Calcium: 9.1 mg/dL (ref 8.9–10.3)
Chloride: 105 mmol/L (ref 98–111)
Creatinine, Ser: 0.91 mg/dL (ref 0.61–1.24)
GFR calc Af Amer: >60 mL/min (ref >60)
GFR calc non Af Amer: >60 mL/min (ref >60)
Glucose, Bld: 115 mg/dL — ABNORMAL HIGH (ref 70–99)
Potassium: 3.5 mmol/L (ref 3.5–5.1)
Sodium: 138 mmol/L (ref 135–145)
Total Bilirubin: 0.5 mg/dL (ref 0.3–1.2)
Total Protein: 6.9 g/dL (ref 6.5–8.1)

## 2018-11-16 LAB — I-STAT TROPONIN, ED: Troponin i, poc: 0.02 ng/mL (ref 0.00–0.08)

## 2018-11-16 MED ORDER — ALBUTEROL SULFATE HFA 108 (90 BASE) MCG/ACT IN AERS
4.0000 | INHALATION_SPRAY | Freq: Once | RESPIRATORY_TRACT | Status: AC
  Administered 2018-11-16: 4 via RESPIRATORY_TRACT
  Filled 2018-11-16: qty 6.7

## 2018-11-16 MED ORDER — AEROCHAMBER PLUS FLO-VU MEDIUM MISC
1.0000 | Freq: Once | Status: AC
  Administered 2018-11-16: 23:00:00 1
  Filled 2018-11-16: qty 1

## 2018-11-16 NOTE — ED Provider Notes (Signed)
[facility] EMERGENCY DEPARTMENT Provider Note   CSN: [id number] Arrival date & time: 11/16/18  2153    History   Chief Complaint Chief Complaint  Patient presents with  . Shortness of Breath  . Dizziness    HPI Randy Campbell is a 43 y.o. male with a hx of asthma, bronchitis presents to the Emergency Department complaining of gradual, persistent, progressively worsening shortness of breath onset around 4-5 PM today.  Reports his chest feels tight across the anterior portion but he does not have chest pain.  Reports that when he walks he feels lightheaded but has no room spinning, vision changes, numbness or weakness.  Patient reports that he has been able to walk without dyspnea on exertion but his shortness of breath seems worse when lying flat.  He has no history of congestive heart failure.  He denies fever, chills, cough, congestion, abdominal pain, nausea, vomiting, diarrhea, syncope, dysuria.  He denies known sick contacts.  Denies periods of immobilization or travel.  He denies leg swelling or calf pain.  He has never had a pulmonary embolism.  Patient reports a remote history of asthma however he does not regularly use an inhaler.     The history is provided by the patient and medical records.    Past Medical History:  Diagnosis Date  . Asthma   . Bronchitis   . Stroke [facility])     There are no active problems to display for this patient.   History reviewed. No pertinent surgical history.      Home Medications    Prior to Admission medications   Medication Sig Start Date End Date Taking? Authorizing Provider  esomeprazole (NEXIUM) 40 MG capsule Take 1 capsule (40 mg total) by mouth daily. Patient not taking: Reported on 11/16/2018 06/20/18   Mardella Layman, MD  sucralfate (CARAFATE) 1 g tablet Take 1 tablet (1 g total) by mouth 4 (four) times daily -  with meals and at bedtime. Patient not taking: Reported on 11/16/2018 06/20/18   Mardella Layman, MD     Family History History reviewed. No pertinent family history.  Social History Social History   Tobacco Use  . Smoking status: Current Every Day Smoker  . Smokeless tobacco: Never Used  Substance Use Topics  . Alcohol use: No  . Drug use: Yes    Types: Marijuana     Allergies   Patient has no known allergies.   Review of Systems Review of Systems  Constitutional: Negative for appetite change, diaphoresis, fatigue, fever and unexpected weight change.  HENT: Negative for mouth sores.   Eyes: Negative for visual disturbance.  Respiratory: Positive for shortness of breath. Negative for cough, chest tightness and wheezing.   Cardiovascular: Negative for chest pain.  Gastrointestinal: Negative for abdominal pain, constipation, diarrhea, nausea and vomiting.  Endocrine: Negative for polydipsia, polyphagia and polyuria.  Genitourinary: Negative for dysuria, frequency, hematuria and urgency.  Musculoskeletal: Negative for back pain and neck stiffness.  Skin: Negative for rash.  Allergic/Immunologic: Negative for immunocompromised state.  Neurological: Negative for dizziness, syncope, light-headedness and headaches.  Hematological: Does not bruise/bleed easily.  Psychiatric/Behavioral: Negative for sleep disturbance. The patient is not nervous/anxious.      Physical Exam Updated Vital Signs BP (!) 140/98 (BP Location: Right Arm)   Pulse 86   Temp 98.2 F (36.8 C) (Oral)   Resp 17   Ht 6\' 1"  (1.854 m)   Wt 106.6 kg   SpO2 98%   BMI 31.00 kg/m  Physical Exam Vitals signs and nursing note reviewed.  Constitutional:      General: He is not in acute distress.    Appearance: He is not diaphoretic.  HENT:     Head: Normocephalic.  Eyes:     General: No scleral icterus.    Conjunctiva/sclera: Conjunctivae normal.  Neck:     Musculoskeletal: Normal range of motion.  Cardiovascular:     Rate and Rhythm: Normal rate and regular rhythm.     Pulses: Normal pulses.           Radial pulses are 2+ on the right side and 2+ on the left side.  Pulmonary:     Effort: No tachypnea, accessory muscle usage, prolonged expiration, respiratory distress or retractions.     Breath sounds: No stridor. No decreased breath sounds, wheezing, rhonchi or rales.     Comments: Equal chest rise. No increased work of breathing. Chest:     Chest wall: No tenderness.  Abdominal:     General: There is no distension.     Palpations: Abdomen is soft.     Tenderness: There is no abdominal tenderness. There is no guarding or rebound.  Musculoskeletal:     Comments: Moves all extremities equally and without difficulty.  Skin:    General: Skin is warm and dry.     Capillary Refill: Capillary refill takes less than 2 seconds.  Neurological:     Mental Status: He is alert.     GCS: GCS eye subscore is 4. GCS verbal subscore is 5. GCS motor subscore is 6.     Comments: Speech is clear and goal oriented.  Psychiatric:        Mood and Affect: Mood normal.      ED Treatments / Results  Labs (all labs ordered are listed, but only abnormal results are displayed) Labs Reviewed  CBC WITH DIFFERENTIAL/PLATELET - Abnormal; Notable for the following components:      Result Value   Hemoglobin 12.5 (*)    All other components within normal limits  COMPREHENSIVE METABOLIC PANEL - Abnormal; Notable for the following components:   Glucose, Bld 115 (*)    All other components within normal limits  I-STAT TROPONIN, ED    EKG EKG Interpretation  Date/Time:  Saturday November 16 2018 22:04:26 EDT Ventricular Rate:  81 PR Interval:    QRS Duration: 107 QT Interval:  394 QTC Calculation: 458 R Axis:   57 Text Interpretation:  Sinus rhythm Multiple premature complexes, vent & supraven Prolonged PR interval Otherwise no significant change Confirmed by Melene PlanFloyd, Dan [phone]([id number]) on 11/16/2018 10:36:01 PM    EKG Interpretation  Date/Time:  Saturday November 16 2018 23:15:56 EDT Ventricular Rate:  70 PR  Interval:    QRS Duration: 86 QT Interval:  399 QTC Calculation: 431 R Axis:   64 Text Interpretation:  Sinus rhythm Borderline prolonged PR interval No significant change since last tracing Since last tracing of earlier today Confirmed by Marily MemosMesner, Jason [phone]([id number]) on 11/16/2018 11:18:09 PM        Radiology Dg Chest Port 1 View  Result Date: 11/16/2018 CLINICAL DATA:  Shortness of breath EXAM: PORTABLE CHEST 1 VIEW COMPARISON:  04/27/2013 FINDINGS: The heart size and mediastinal contours are within normal limits. Both lungs are clear. The visualized skeletal structures are unremarkable. IMPRESSION: No active disease. Electronically Signed   By: Deatra RobinsonKevin  Herman M.D.   On: 11/16/2018 23:32    Procedures Procedures (including critical care time)  Medications Ordered in ED  Medications  AeroChamber Plus Flo-Vu Medium MISC 1 each (1 each Other Given 11/16/18 2309)  albuterol (PROVENTIL HFA;VENTOLIN HFA) 108 (90 Base) MCG/ACT inhaler 4 puff (4 puffs Inhalation Given 11/16/18 2305)     Initial Impression / Assessment and Plan / ED Course  I have reviewed the triage vital signs and the nursing notes.  Pertinent labs & imaging results that were available during my care of the patient were reviewed by me and considered in my medical decision making (see chart for details).  Clinical Course as of Nov 17 3  Sat Nov 16, 2018  2245 Initial EKG with PVCs.  EKG 12-Lead [HM]  2319 Repeat EKG without acute abnormalities.  No evidence of ischemia.  EKG 12-Lead [HM]  Sun Nov 17, 2018  0001 Pt reports feeling significantly better after albuterol without persistent shortness of breath or lightheadedness.     [HM]    Clinical Course User Index [HM] Candace Begue, Dylanger, Fuentez was evaluated in Emergency Department on 11/17/2018 for the symptoms described in the history of present illness. He was evaluated in the context of the global COVID-19 pandemic, which necessitated consideration  that the patient might be at risk for infection with the SARS-CoV-2 virus that causes COVID-19. Institutional protocols and algorithms that pertain to the evaluation of patients at risk for COVID-19 are in a state of rapid change based on information released by regulatory bodies including the CDC and federal and state organizations. These policies and algorithms were followed during the patient's care in the ED.   Pt presents to the emergency department with shortness of breath.  He has a remote history of asthma, but is not currently treated.  Labs are reassuring with only mild anemia.  Troponin negative and EKG nonischemic.  Chest x-ray without evidence of pneumothorax, pulmonary edema or pneumonia.  PERC negative, doubt PE.  Patient with complete resolution of symptoms after albuterol administration.  He ambulates here in the emergency department without respiratory distress or continued feelings of lightheadedness.  States he feels well and is breathing at baseline.  Will discharge home with conservative therapies and albuterol.  Discussed reasons to return immediately to the emergency department including high fevers, worsening shortness of breath or other concerns.  BP 126/86 (BP Location: Left Arm)   Pulse 69   Temp 98.2 F (36.8 C) (Oral)   Resp (!) 21   Ht 6\' 1"  (1.854 m)   Wt 106.6 kg   SpO2 96%   BMI 31.00 kg/m    Final Clinical Impressions(s) / ED Diagnoses   Final diagnoses:  Shortness of breath    ED Discharge Orders    None       Milta Deiters 11/17/18 0005    Mesner, Barbara Cower, MD 11/17/18 [phone]

## 2018-11-16 NOTE — ED Triage Notes (Signed)
Pt reports shortness of breath around 1500 today, with dizziness, tightnesss in the chest with no pain.

## 2018-11-17 NOTE — Discharge Instructions (Addendum)
1. Medications: albuterol, usual home medications °2. Treatment: rest, drink plenty of fluids, begin OTC antihistamine (Zyrtec or Claritin)  °3. Follow Up: Please followup with your primary doctor in 2-3 days for discussion of your diagnoses and further evaluation after today's visit; if you do not have a primary care doctor use the resource guide provided to find one; Please return to the ER for difficulty breathing, high fevers or worsening symptoms. ° °

## 2019-06-26 ENCOUNTER — Other Ambulatory Visit: Payer: Self-pay

## 2019-06-26 ENCOUNTER — Encounter (HOSPITAL_COMMUNITY): Payer: Self-pay

## 2019-06-26 ENCOUNTER — Emergency Department (HOSPITAL_COMMUNITY)
Admission: EM | Admit: 2019-06-26 | Discharge: 2019-06-26 | Disposition: A | Discharge status: Home / Self Care | Payer: BC Managed Care – PPO | Attending: Emergency Medicine | Admitting: Emergency Medicine

## 2019-06-26 DIAGNOSIS — Z8673 Personal history of transient ischemic attack (TIA), and cerebral infarction without residual deficits: Secondary | ICD-10-CM | POA: Diagnosis not present

## 2019-06-26 DIAGNOSIS — J45909 Unspecified asthma, uncomplicated: Secondary | ICD-10-CM | POA: Diagnosis not present

## 2019-06-26 DIAGNOSIS — R1084 Generalized abdominal pain: Secondary | ICD-10-CM | POA: Diagnosis present

## 2019-06-26 DIAGNOSIS — F17200 Nicotine dependence, unspecified, uncomplicated: Secondary | ICD-10-CM | POA: Diagnosis not present

## 2019-06-26 DIAGNOSIS — K2900 Acute gastritis without bleeding: Secondary | ICD-10-CM

## 2019-06-26 DIAGNOSIS — K5900 Constipation, unspecified: Secondary | ICD-10-CM | POA: Diagnosis not present

## 2019-06-26 LAB — CBC
HCT: 42.8 % (ref 39.0–52.0)
Hemoglobin: 13.6 g/dL (ref 13.0–17.0)
MCH: 28.8 pg (ref 26.0–34.0)
MCHC: 31.8 g/dL (ref 30.0–36.0)
MCV: 90.7 fL (ref 80.0–100.0)
Platelets: 306 10*3/uL (ref 150–400)
RBC: 4.72 MIL/uL (ref 4.22–5.81)
RDW: 12.6 % (ref 11.5–15.5)
WBC: 4.7 10*3/uL (ref 4.0–10.5)
nRBC: 0 % (ref 0.0–0.2)

## 2019-06-26 LAB — URINALYSIS, ROUTINE W REFLEX MICROSCOPIC
Bacteria, UA: NONE SEEN
Bilirubin Urine: NEGATIVE
Glucose, UA: NEGATIVE mg/dL
Ketones, ur: 5 mg/dL — AB
Leukocytes,Ua: NEGATIVE
Nitrite: NEGATIVE
Protein, ur: NEGATIVE mg/dL
Specific Gravity, Urine: 1.019 (ref 1.005–1.030)
pH: 6 (ref 5.0–8.0)

## 2019-06-26 LAB — COMPREHENSIVE METABOLIC PANEL
ALT: 14 U/L (ref 0–44)
AST: 20 U/L (ref 15–41)
Albumin: 4.3 g/dL (ref 3.5–5.0)
Alkaline Phosphatase: 56 U/L (ref 38–126)
Anion gap: 11 (ref 5–15)
BUN: 7 mg/dL (ref 6–20)
CO2: 23 mmol/L (ref 22–32)
Calcium: 9.2 mg/dL (ref 8.9–10.3)
Chloride: 102 mmol/L (ref 98–111)
Creatinine, Ser: 0.84 mg/dL (ref 0.61–1.24)
GFR calc Af Amer: >60 mL/min (ref >60)
GFR calc non Af Amer: >60 mL/min (ref >60)
Glucose, Bld: 104 mg/dL — ABNORMAL HIGH (ref 70–99)
Potassium: 3.7 mmol/L (ref 3.5–5.1)
Sodium: 136 mmol/L (ref 135–145)
Total Bilirubin: 0.9 mg/dL (ref 0.3–1.2)
Total Protein: 7.4 g/dL (ref 6.5–8.1)

## 2019-06-26 LAB — LIPASE, BLOOD: Lipase: 34 U/L (ref 11–51)

## 2019-06-26 MED ORDER — SODIUM CHLORIDE 0.9 % IV BOLUS
1000.0000 mL | Freq: Once | INTRAVENOUS | Status: AC
Start: 2019-06-26 — End: 2019-06-26
  Administered 2019-06-26: 18:00:00 1000 mL via INTRAVENOUS

## 2019-06-26 MED ORDER — FAMOTIDINE IN NACL 20-0.9 MG/50ML-% IV SOLN
20.0000 mg | Freq: Once | INTRAVENOUS | Status: AC
  Administered 2019-06-26: 20 mg via INTRAVENOUS
  Filled 2019-06-26: qty 50

## 2019-06-26 MED ORDER — ALUMINUM-MAGNESIUM-SIMETHICONE 200-200-20 MG/5ML PO SUSP: 15.0000 mL | Freq: Three times a day (TID) | ORAL | 0 refills | Status: AC

## 2019-06-26 MED ORDER — ALUM & MAG HYDROXIDE-SIMETH 200-200-20 MG/5ML PO SUSP
30.0000 mL | Freq: Once | ORAL | Status: AC
  Administered 2019-06-26: 30 mL via ORAL
  Filled 2019-06-26: qty 30

## 2019-06-26 MED ORDER — METOCLOPRAMIDE HCL 5 MG/ML IJ SOLN
5.0000 mg | Freq: Once | INTRAMUSCULAR | Status: AC
  Administered 2019-06-26: 5 mg via INTRAVENOUS
  Filled 2019-06-26: qty 2

## 2019-06-26 MED ORDER — FAMOTIDINE 20 MG PO TABS: 20.0000 mg | ORAL_TABLET | Freq: Two times a day (BID) | ORAL | 0 refills | Status: AC

## 2019-06-26 MED ORDER — POLYETHYLENE GLYCOL 3350 17 G PO PACK: 17.0000 g | PACK | Freq: Every day | ORAL | 0 refills | Status: DC

## 2019-06-26 NOTE — ED Provider Notes (Signed)
[facility] EMERGENCY DEPARTMENT Provider Note   CSN: [id number] Arrival date & time: 06/26/19  1506     History   Chief Complaint Chief Complaint  Patient presents with  . Abdominal Pain  . Nausea    HPI Randy Campbell is a 43 y.o. male with a past medical history of asthma presenting to the ED with a chief complaint of abdominal pain and nausea.  On 06/21/2019 began having cramping abdominal pain with associated nausea.  Pain is worse with eating and drinking.  Reports constipation, no bowel movement since yesterday.  Pain improved with certain movements. He is unsure what may have triggered the symptoms as he denies any suspicious food ingestions or sick contacts with similar symptoms.  No history of similar symptoms in the past.  He has tried Gas-X and Pepto-Bismol with no improvement in his symptoms.  Denies any hematochezia, melena or hematemesis.  Denies prior abdominal surgeries.  Reports occasional alcohol use, daily marijuana use.     HPI  Past Medical History:  Diagnosis Date  . Asthma   . Bronchitis   . Stroke [facility])     There are no active problems to display for this patient.   History reviewed. No pertinent surgical history.      Home Medications    Prior to Admission medications   Medication Sig Start Date End Date Taking? Authorizing Provider  aluminum-magnesium hydroxide-simethicone (MAALOX) [phone] MG/5ML SUSP Take 15 mLs by mouth 4 (four) times daily -  before meals and at bedtime. 06/26/19   Maksim Peregoy, PA-C  esomeprazole (NEXIUM) 40 MG capsule Take 1 capsule (40 mg total) by mouth daily. Patient not taking: Reported on 11/16/2018 06/20/18   Vanessa Kick, MD  famotidine (PEPCID) 20 MG tablet Take 1 tablet (20 mg total) by mouth 2 (two) times daily. 06/26/19   Everard Interrante, PA-C  polyethylene glycol (MIRALAX / GLYCOLAX) 17 g packet Take 17 g by mouth daily. 06/26/19   Caliann Leckrone, PA-C  sucralfate (CARAFATE) 1 g tablet Take 1 tablet  (1 g total) by mouth 4 (four) times daily -  with meals and at bedtime. Patient not taking: Reported on 11/16/2018 06/20/18   Vanessa Kick, MD    Family History History reviewed. No pertinent family history.  Social History Social History   Tobacco Use  . Smoking status: Current Every Day Smoker  . Smokeless tobacco: Never Used  Substance Use Topics  . Alcohol use: No  . Drug use: Yes    Types: Marijuana     Allergies   Patient has no known allergies.   Review of Systems Review of Systems  Constitutional: Negative for appetite change, chills and fever.  HENT: Negative for ear pain, rhinorrhea, sneezing and sore throat.   Eyes: Negative for photophobia and visual disturbance.  Respiratory: Negative for cough, chest tightness, shortness of breath and wheezing.   Cardiovascular: Negative for chest pain and palpitations.  Gastrointestinal: Positive for abdominal pain, nausea and vomiting. Negative for blood in stool, constipation and diarrhea.  Genitourinary: Negative for dysuria, hematuria and urgency.  Musculoskeletal: Negative for myalgias.  Skin: Negative for rash.  Neurological: Negative for dizziness, weakness and light-headedness.     Physical Exam Updated Vital Signs BP 134/87   Pulse (!) 53   Temp 98.2 F (36.8 C) (Oral)   Resp 16   SpO2 100%   Physical Exam Vitals signs and nursing note reviewed.  Constitutional:      General: He is not in acute  distress.    Appearance: He is well-developed.  HENT:     Head: Normocephalic and atraumatic.     Nose: Nose normal.  Eyes:     General: No scleral icterus.       Left eye: No discharge.     Conjunctiva/sclera: Conjunctivae normal.  Neck:     Musculoskeletal: Normal range of motion and neck supple.  Cardiovascular:     Rate and Rhythm: Normal rate and regular rhythm.     Heart sounds: Normal heart sounds. No murmur. No friction rub. No gallop.   Pulmonary:     Effort: Pulmonary effort is normal. No  respiratory distress.     Breath sounds: Normal breath sounds.  Abdominal:     General: Bowel sounds are normal. There is no distension.     Palpations: Abdomen is soft.     Tenderness: There is generalized abdominal tenderness. There is no guarding.  Musculoskeletal: Normal range of motion.  Skin:    General: Skin is warm and dry.     Findings: No rash.  Neurological:     Mental Status: He is alert.     Motor: No abnormal muscle tone.     Coordination: Coordination normal.      ED Treatments / Results  Labs (all labs ordered are listed, but only abnormal results are displayed) Labs Reviewed  COMPREHENSIVE METABOLIC PANEL - Abnormal; Notable for the following components:      Result Value   Glucose, Bld 104 (*)    All other components within normal limits  URINALYSIS, ROUTINE W REFLEX MICROSCOPIC - Abnormal; Notable for the following components:   Hgb urine dipstick SMALL (*)    Ketones, ur 5 (*)    All other components within normal limits  LIPASE, BLOOD  CBC    EKG None  Radiology No results found.  Procedures Procedures (including critical care time)  Medications Ordered in ED Medications  sodium chloride 0.9 % bolus 1,000 mL (0 mLs Intravenous Stopped 06/26/19 2119)  famotidine (PEPCID) IVPB 20 mg premix (0 mg Intravenous Stopped 06/26/19 1900)  metoCLOPramide (REGLAN) injection 5 mg (5 mg Intravenous Given 06/26/19 1831)  alum & mag hydroxide-simeth (MAALOX/MYLANTA) 200-200-20 MG/5ML suspension 30 mL (30 mLs Oral Given 06/26/19 1831)     Initial Impression / Assessment and Plan / ED Course  I have reviewed the triage vital signs and the nursing notes.  Pertinent labs & imaging results that were available during my care of the patient were reviewed by me and considered in my medical decision making (see chart for details).        43yo M with a past medical history of asthma and GERD presented to the ED with a chief complaint of abdominal pain and  nausea.  5 days ago began having cramping abdominal pain with associated nausea.  Reports constipation, no bowel movement since yesterday.  Reports generalized abdominal pain without focal tenderness.  On exam patient is overall well-appearing.  Abdomen is generally tender without rebound or guarding focally.  Vital signs are reassuring.  Lab work including CBC, CMP, lipase unremarkable.  Urinalysis unremarkable.  Patient was given IV fluids, Reglan, Pepcid and Maalox with resolution of his symptoms.  Repeat abdominal exams are benign.  He was able to tolerate p.o. intake without difficulty.  Suspect that his symptoms are due to gastritis.  Doubt cholecystitis, appendicitis or other surgical emergent cause of symptoms.  He is comfortable with discharge home, follow-up with PCP.  Patient is hemodynamically stable,  in NAD, and able to ambulate in the ED. Evaluation does not show pathology that would require ongoing emergent intervention or inpatient treatment. I explained the diagnosis to the patient. Pain has been managed and has no complaints prior to discharge. Patient is comfortable with above plan and is stable for discharge at this time. All questions were answered prior to disposition. Strict return precautions for returning to the ED were discussed. Encouraged follow up with PCP.   An After Visit Summary was printed and given to the patient.   Portions of this note were generated with Scientist, clinical (histocompatibility and immunogenetics)Dragon dictation software. Dictation errors may occur despite best attempts at proofreading.   Final Clinical Impressions(s) / ED Diagnoses   Final diagnoses:  Acute gastritis without hemorrhage, unspecified gastritis type  Constipation, unspecified constipation type    ED Discharge Orders         Ordered    famotidine (PEPCID) 20 MG tablet  2 times daily     06/26/19 2129    aluminum-magnesium hydroxide-simethicone (MAALOX) 200-200-20 MG/5ML SUSP  3 times daily before meals & bedtime     06/26/19 2129     polyethylene glycol (MIRALAX / GLYCOLAX) 17 g packet  Daily     06/26/19 2133           Dietrich PatesKhatri, Darnella Zeiter, PA-C 06/26/19 2134    Gerhard MunchLockwood, Elio, MD 06/26/19 2353

## 2019-06-26 NOTE — ED Triage Notes (Signed)
Pt reports that for 3 days now every time he eats his stomach cramps extremely bad. Pt reports he has not had a bowel movement since Tuesday. Pt reports N/V.

## 2019-06-26 NOTE — Discharge Instructions (Signed)
Take the medications as prescribed. Follow-up with your primary care provider. Return to the ED if you start to have worsening symptoms, develop a fever, blood in your stool, vomiting up blood, chest pain or shortness of breath.

## 2019-11-22 IMAGING — DX PORTABLE CHEST - 1 VIEW
1 series · 1 of 1 positions shown · non-contrast
Comparison: 04/27/2013

CLINICAL DATA: Shortness of breath

EXAM:
PORTABLE CHEST 1 VIEW

[chest ap]
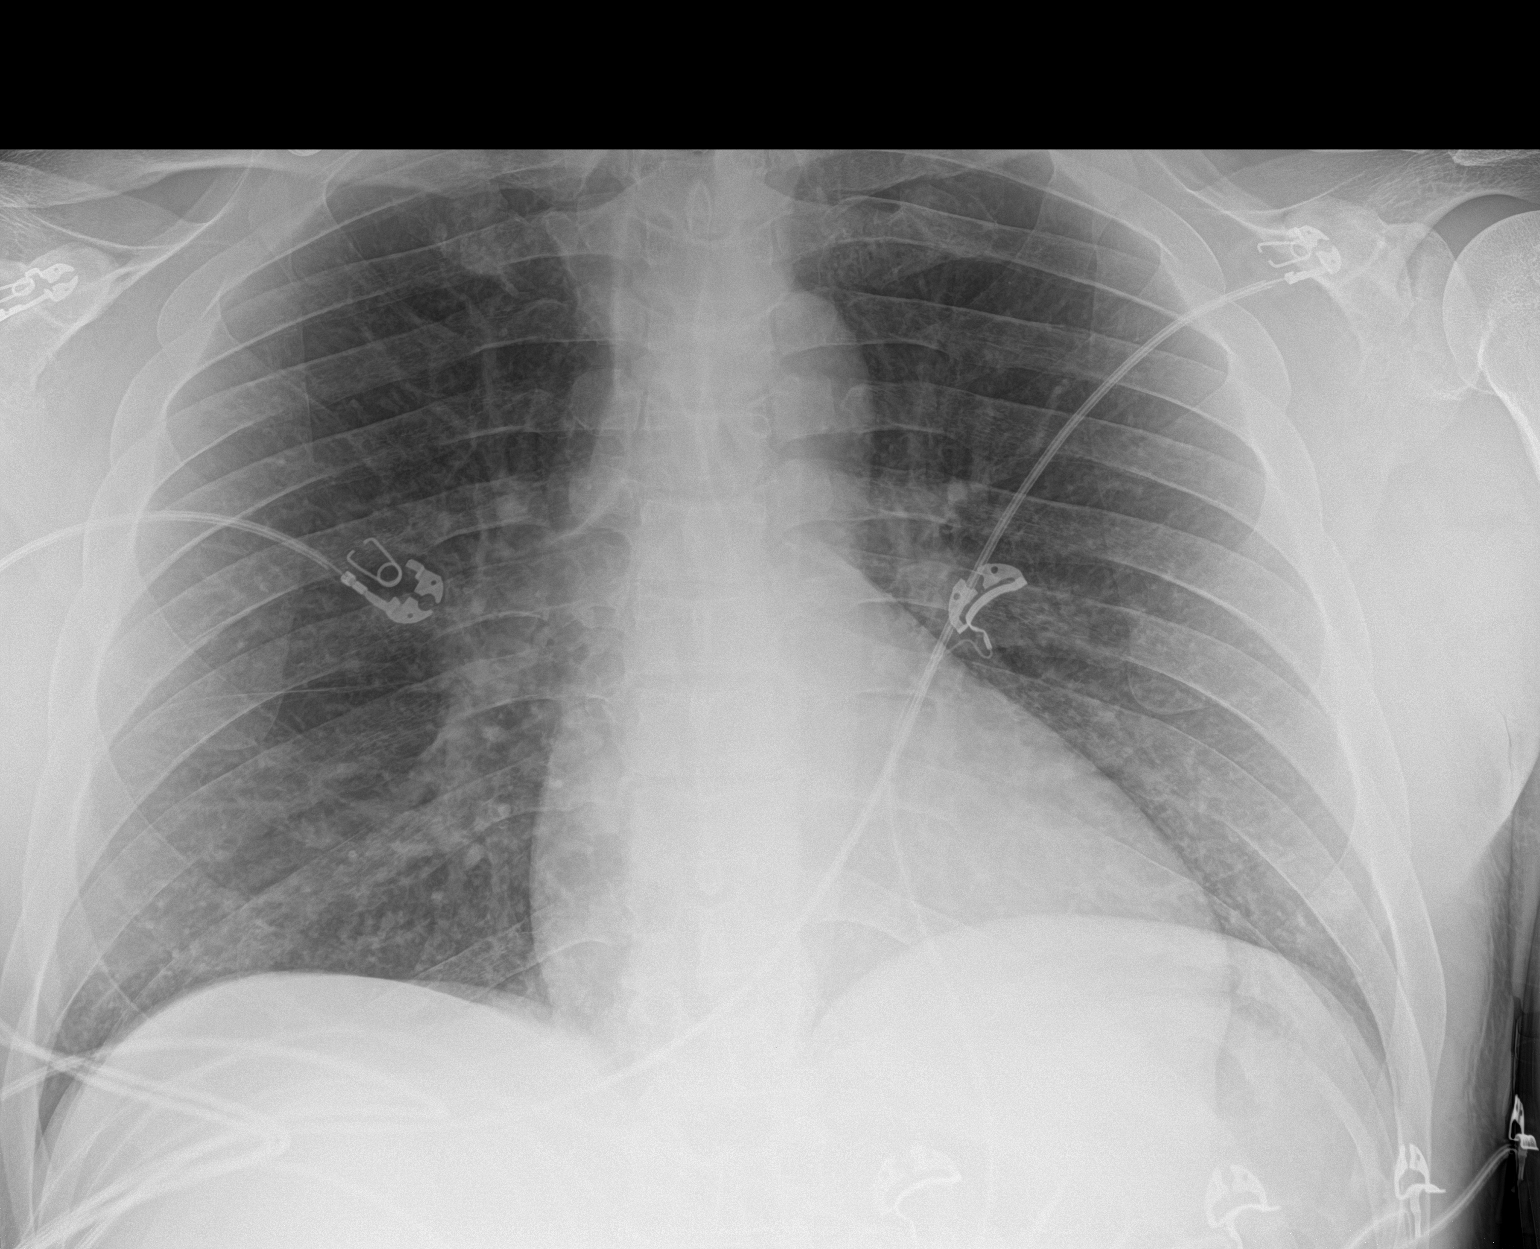

[1 of 1 positions shown; findings below may reference images not displayed]

FINDINGS: The heart size and mediastinal contours are within normal limits.
Both lungs are clear. The visualized skeletal structures are
unremarkable.
IMPRESSION: No active disease.

## 2020-06-28 ENCOUNTER — Encounter (HOSPITAL_COMMUNITY): Payer: Self-pay | Admitting: Emergency Medicine

## 2020-06-28 ENCOUNTER — Emergency Department (HOSPITAL_COMMUNITY)
Admission: EM | Admit: 2020-06-28 | Discharge: 2020-06-28 | Disposition: A | Discharge status: Home / Self Care | Payer: BC Managed Care – PPO | Attending: Emergency Medicine | Admitting: Emergency Medicine

## 2020-06-28 DIAGNOSIS — Z5321 Procedure and treatment not carried out due to patient leaving prior to being seen by health care provider: Secondary | ICD-10-CM | POA: Insufficient documentation

## 2020-06-28 DIAGNOSIS — H538 Other visual disturbances: Secondary | ICD-10-CM | POA: Insufficient documentation

## 2020-06-28 DIAGNOSIS — R200 Anesthesia of skin: Secondary | ICD-10-CM | POA: Insufficient documentation

## 2020-06-28 DIAGNOSIS — R519 Headache, unspecified: Secondary | ICD-10-CM | POA: Insufficient documentation

## 2020-06-28 LAB — COMPREHENSIVE METABOLIC PANEL
ALT: 23 U/L (ref 0–44)
AST: 25 U/L (ref 15–41)
Albumin: 4 g/dL (ref 3.5–5.0)
Alkaline Phosphatase: 49 U/L (ref 38–126)
Anion gap: 10 (ref 5–15)
BUN: 8 mg/dL (ref 6–20)
CO2: 23 mmol/L (ref 22–32)
Calcium: 8.8 mg/dL — ABNORMAL LOW (ref 8.9–10.3)
Chloride: 105 mmol/L (ref 98–111)
Creatinine, Ser: 0.93 mg/dL (ref 0.61–1.24)
GFR, Estimated: >60 mL/min (ref >60)
Glucose, Bld: 106 mg/dL — ABNORMAL HIGH (ref 70–99)
Potassium: 3.9 mmol/L (ref 3.5–5.1)
Sodium: 138 mmol/L (ref 135–145)
Total Bilirubin: 0.7 mg/dL (ref 0.3–1.2)
Total Protein: 6.9 g/dL (ref 6.5–8.1)

## 2020-06-28 LAB — CBC WITH DIFFERENTIAL/PLATELET
Abs Immature Granulocytes: 0.01 10*3/uL (ref 0.00–0.07)
Basophils Absolute: 0.1 10*3/uL (ref 0.0–0.1)
Basophils Relative: 1 %
Eosinophils Absolute: 1 10*3/uL — ABNORMAL HIGH (ref 0.0–0.5)
Eosinophils Relative: 16 %
HCT: 41 % (ref 39.0–52.0)
Hemoglobin: 13 g/dL (ref 13.0–17.0)
Immature Granulocytes: 0 %
Lymphocytes Relative: 46 %
Lymphs Abs: 2.8 10*3/uL (ref 0.7–4.0)
MCH: 28.8 pg (ref 26.0–34.0)
MCHC: 31.7 g/dL (ref 30.0–36.0)
MCV: 90.7 fL (ref 80.0–100.0)
Monocytes Absolute: 0.5 10*3/uL (ref 0.1–1.0)
Monocytes Relative: 9 %
Neutro Abs: 1.7 10*3/uL (ref 1.7–7.7)
Neutrophils Relative %: 28 %
Platelets: 364 10*3/uL (ref 150–400)
RBC: 4.52 MIL/uL (ref 4.22–5.81)
RDW: 13.2 % (ref 11.5–15.5)
WBC: 6.2 10*3/uL (ref 4.0–10.5)
nRBC: 0 % (ref 0.0–0.2)

## 2020-06-28 NOTE — ED Notes (Signed)
Called pt for vitals no answer 

## 2020-06-28 NOTE — ED Notes (Signed)
Called pt for vitals no answer X3 

## 2020-06-28 NOTE — ED Notes (Signed)
Called 2x for vitals without response

## 2020-06-28 NOTE — ED Triage Notes (Signed)
Pt reports sharp pains in his head and R sided blurred vision, reports that his bp has been running high. States that symptoms began last week. Also endorses bilateral hand numbness off and on, a/ox4, speech clear, face symmetrical, moves all limbs equally.

## 2020-12-29 ENCOUNTER — Ambulatory Visit (AMBULATORY_SURGERY_CENTER): Payer: Self-pay

## 2020-12-29 ENCOUNTER — Other Ambulatory Visit: Payer: Self-pay

## 2020-12-29 VITALS — Ht 71.0 in | Wt 240.0 lb

## 2020-12-29 DIAGNOSIS — Z1211 Encounter for screening for malignant neoplasm of colon: Secondary | ICD-10-CM

## 2020-12-29 NOTE — Progress Notes (Signed)
No allergies to soy or egg Pt is not on blood thinners or diet pills Pt Denies issues with sedation/intubation--has never had any surgeries or procedures. Denies atrial flutter/fib Denies constipation   Pt is aware of Covid safety and care partner requirements.

## 2021-01-05 ENCOUNTER — Encounter: Payer: Self-pay | Admitting: Internal Medicine

## 2021-01-11 ENCOUNTER — Encounter: Payer: Self-pay | Admitting: Certified Registered Nurse Anesthetist

## 2021-01-12 ENCOUNTER — Encounter: Payer: Self-pay | Admitting: Internal Medicine

## 2021-01-12 ENCOUNTER — Other Ambulatory Visit: Payer: Self-pay

## 2021-01-12 ENCOUNTER — Ambulatory Visit (AMBULATORY_SURGERY_CENTER): Payer: BC Managed Care – PPO | Admitting: Internal Medicine

## 2021-01-12 VITALS — BP 126/91 | HR 72 | Temp 97.8°F | Resp 32 | Ht 71.0 in | Wt 240.0 lb

## 2021-01-12 DIAGNOSIS — Z1211 Encounter for screening for malignant neoplasm of colon: Secondary | ICD-10-CM | POA: Diagnosis present

## 2021-01-12 MED ORDER — SODIUM CHLORIDE 0.9 % IV SOLN: 500.0000 mL | Freq: Once | INTRAVENOUS | Status: DC

## 2021-01-12 NOTE — Progress Notes (Signed)
Pt's states no medical or surgical changes since previsit or office visit. 

## 2021-01-12 NOTE — Progress Notes (Signed)
C.W. VITAL SIGNS. 

## 2021-01-12 NOTE — Op Note (Signed)
[facility] Patient Name: Randy Campbell Procedure Date: 01/12/2021 9:41 AM MRN: [id number] Endoscopist: Iva Boop , MD Age: 45 Referring MD:  Date of Birth: 23-Jun-1976 Gender: Male Account #: [id number] Procedure:                Colonoscopy Indications:              Screening for colorectal malignant neoplasm, This                            is the patient's first colonoscopy Medicines:                Propofol per Anesthesia, Monitored Anesthesia Care Procedure:                Pre-Anesthesia Assessment:                           - Prior to the procedure, a History and Physical                            was performed, and patient medications and                            allergies were reviewed. The patient's tolerance of                            previous anesthesia was also reviewed. The risks                            and benefits of the procedure and the sedation                            options and risks were discussed with the patient.                            All questions were answered, and informed consent                            was obtained. Prior Anticoagulants: The patient has                            taken no previous anticoagulant or antiplatelet                            agents. ASA Grade Assessment: III - A patient with                            severe systemic disease. After reviewing the risks                            and benefits, the patient was deemed in                            satisfactory condition to undergo the procedure.  After obtaining informed consent, the colonoscope                            was passed under direct vision. Throughout the                            procedure, the patient's blood pressure, pulse, and                            oxygen saturations were monitored continuously. The                            Olympus CF-HQ190 [id number]) Colonoscope was                             introduced through the anus and advanced to the the                            cecum, identified by appendiceal orifice and                            ileocecal valve. The colonoscopy was somewhat                            difficult due to significant looping. Successful                            completion of the procedure was aided by applying                            abdominal pressure. The patient tolerated the                            procedure well. The quality of the bowel                            preparation was good. The ileocecal valve,                            appendiceal orifice, and rectum were photographed.                            The bowel preparation used was Miralax via split                            dose instruction. Scope In: 9:55:41 AM Scope Out: 10:08:14 AM Scope Withdrawal Time: 0 hours 8 minutes 58 seconds  Total Procedure Duration: 0 hours 12 minutes 33 seconds  Findings:                 The perianal and digital rectal examinations were                            normal. Pertinent negatives include normal prostate                            (  size, shape, and consistency).                           External and internal hemorrhoids were found. The                            hemorrhoids were small.                           The exam was otherwise without abnormality on                            direct and retroflexion views. Complications:            No immediate complications. Estimated Blood Loss:     Estimated blood loss: none. Impression:               - External and internal hemorrhoids.                           - The examination was otherwise normal on direct                            and retroflexion views.                           - No specimens collected. Recommendation:           - Patient has a contact number available for                            emergencies. The signs and symptoms of potential                            delayed  complications were discussed with the                            patient. Return to normal activities tomorrow.                            Written discharge instructions were provided to the                            patient.                           - Resume previous diet.                           - Continue present medications.                           - Repeat colonoscopy in 10 years for screening                            purposes. Iva Boop, MD 01/12/2021 10:14:24 AM This report has been signed electronically.

## 2021-01-12 NOTE — Progress Notes (Signed)
Report given to PACU, vss 

## 2021-01-12 NOTE — Patient Instructions (Addendum)
No polyps or cancer seen.  Next routine colonoscopy or other screening test in 10 years - 2032.  I appreciate the opportunity to care for you. Iva Boop, MD, [facility] Resume previous diet Continue current medications Repeat colonoscopy in 10 years  YOU HAD AN ENDOSCOPIC PROCEDURE TODAY AT THE [facility]:   Refer to the procedure report that was given to you for any specific questions about what was found during the examination.  If the procedure report does not answer your questions, please call your gastroenterologist to clarify.  If you requested that your care partner not be given the details of your procedure findings, then the procedure report has been included in a sealed envelope for you to review at your convenience later.  YOU SHOULD EXPECT: Some feelings of bloating in the abdomen. Passage of more gas than usual.  Walking can help get rid of the air that was put into your GI tract during the procedure and reduce the bloating. If you had a lower endoscopy (such as a colonoscopy or flexible sigmoidoscopy) you may notice spotting of blood in your stool or on the toilet paper. If you underwent a bowel prep for your procedure, you may not have a normal bowel movement for a few days.  Please Note:  You might notice some irritation and congestion in your nose or some drainage.  This is from the oxygen used during your procedure.  There is no need for concern and it should clear up in a day or so.  SYMPTOMS TO REPORT IMMEDIATELY:   Following lower endoscopy (colonoscopy or flexible sigmoidoscopy):  Excessive amounts of blood in the stool  Significant tenderness or worsening of abdominal pains  Swelling of the abdomen that is new, acute  Fever of 100F or higher For urgent or emergent issues, a gastroenterologist can be reached at any hour by calling [phone]. Do not use MyChart messaging for urgent concerns.   DIET:  We do recommend a small meal at first, but then  you may proceed to your regular diet.  Drink plenty of fluids but you should avoid alcoholic beverages for 24 hours.  ACTIVITY:  You should plan to take it easy for the rest of today and you should NOT DRIVE or use heavy machinery until tomorrow (because of the sedation medicines used during the test).    FOLLOW UP: Our staff will call the number listed on your records 48-72 hours following your procedure to check on you and address any questions or concerns that you may have regarding the information given to you following your procedure. If we do not reach you, we will leave a message.  We will attempt to reach you two times.  During this call, we will ask if you have developed any symptoms of COVID 19. If you develop any symptoms (ie: fever, flu-like symptoms, shortness of breath, cough etc.) before then, please call [phone].  If you test positive for Covid 19 in the 2 weeks post procedure, please call and report this information to [place].    If any biopsies were taken you will be contacted by phone or by letter within the next 1-3 weeks.  Please call us at [phone] if you have not heard about the biopsies in 3 weeks.   SIGNATURES/CONFIDENTIALITY: You and/or your care partner have signed paperwork which will be entered into your electronic medical record.  These signatures attest to the fact that that the information above on your After Visit Summary  has been reviewed and is understood.  Full responsibility of the confidentiality of this discharge information lies with you and/or your care-partner. 

## 2021-01-14 ENCOUNTER — Telehealth: Payer: Self-pay

## 2021-01-14 NOTE — Telephone Encounter (Signed)
  Follow up Call-  Call back number 01/12/2021  Post procedure Call Back phone  # [phone]  Permission to leave phone message Yes  Some recent data might be hidden     Patient questions:  Do you have a fever, pain , or abdominal swelling? No. Pain Score  0 *  Have you tolerated food without any problems? Yes.    Have you been able to return to your normal activities? Yes.    Do you have any questions about your discharge instructions: Diet   No. Medications  No. Follow up visit  No.  Do you have questions or concerns about your Care? No.   Actions: * If pain score is 4 or above: No action needed, pain <4.   1. Have you developed a fever since your procedure? No   2.   Have you had an respiratory symptoms (SOB or cough) since your procedure? No   3.   Have you tested positive for COVID 19 since your procedure no   4.   Have you had any family members/close contacts diagnosed with the COVID 19 since your procedure?  No    If yes to any of these questions please route to Laverna Peace, RN and Karlton Lemon, RN

## 2022-04-22 ENCOUNTER — Emergency Department (HOSPITAL_COMMUNITY): Payer: Self-pay

## 2022-04-22 ENCOUNTER — Encounter (HOSPITAL_COMMUNITY): Payer: Self-pay

## 2022-04-22 ENCOUNTER — Other Ambulatory Visit: Payer: Self-pay

## 2022-04-22 ENCOUNTER — Emergency Department (HOSPITAL_COMMUNITY)
Admission: EM | Admit: 2022-04-22 | Discharge: 2022-04-22 | Disposition: A | Discharge status: Home / Self Care | Payer: Self-pay | Attending: Emergency Medicine | Admitting: Emergency Medicine

## 2022-04-22 DIAGNOSIS — W108XXA Fall (on) (from) other stairs and steps, initial encounter: Secondary | ICD-10-CM

## 2022-04-22 DIAGNOSIS — R0781 Pleurodynia: Secondary | ICD-10-CM | POA: Insufficient documentation

## 2022-04-22 DIAGNOSIS — W109XXA Fall (on) (from) unspecified stairs and steps, initial encounter: Secondary | ICD-10-CM | POA: Insufficient documentation

## 2022-04-22 MED ORDER — LIDOCAINE 5 % EX PTCH
1.0000 | MEDICATED_PATCH | CUTANEOUS | Status: DC
  Administered 2022-04-22: 1 via TRANSDERMAL
  Filled 2022-04-22: qty 1

## 2022-04-22 NOTE — Discharge Instructions (Signed)
As we discussed, x-ray imaging of your ribs was negative for fracture.  I have given you a lidocaine patch to help with your pain.  You may also take Tylenol and ibuprofen for additional symptomatic relief.  You may also use ice on areas that are tender for relief.  Please be aware that in the next 24 to 48 hours you will probably have increasing muscle soreness.  Follow-up with your primary care doctor as needed for continued evaluation and management.  Return if development of any new or worsening symptoms.

## 2022-04-22 NOTE — ED Notes (Signed)
Pt endorses left sided rib pain following a fall on the steps early this AM.

## 2022-04-22 NOTE — ED Provider Notes (Signed)
[facility] EMERGENCY DEPARTMENT Provider Note   CSN: [id number] Arrival date & time: 04/22/22  8032     History  No chief complaint on file.   Randy Campbell is a 46 y.o. male.  Patient with no pertinent past medical history presents today with complaints of fall.  He states that in the night last night he got out of bed to use the bathroom and was still waking up and accidentally tripped and fell backwards down the steps. He is unsure how many steps but states around 15. He denies hitting his head or LOC. He is not anticoagulated. Complaining of pain to his left posterior ribcage. Endorses mild pain with inspiration. Denies headache, neck or back pain, lightheadedness, dizziness, vision changes, nausea, or vomiting. Also denies shortness of breath.  No sharp shooting pain radiating down the legs, numbness/tingling, loss of bowel or bladder function, or saddle paresthesias.  The history is provided by the patient. No language interpreter was used.       Home Medications Prior to Admission medications   Medication Sig Start Date End Date Taking? Authorizing Provider  albuterol (PROAIR HFA) 108 (90 Base) MCG/ACT inhaler ProAir HFA 90 mcg/actuation aerosol inhaler  INHALE 2 PUFFS PO Q 4 H PRF WHEEZING    [provider]  aluminum-magnesium hydroxide-simethicone (MAALOX) 200-200-20 MG/5ML SUSP Take 15 mLs by mouth 4 (four) times daily -  before meals and at bedtime. 06/26/19   Khatri, Hina, PA-C  esomeprazole (NEXIUM) 40 MG capsule Take 1 capsule (40 mg total) by mouth daily. Patient not taking: No sig reported 06/20/18   Mardella Layman, MD  famotidine (PEPCID) 20 MG tablet Take 1 tablet (20 mg total) by mouth 2 (two) times daily. Patient not taking: No sig reported 06/26/19   Dietrich Pates, PA-C  omeprazole (PRILOSEC) 20 MG capsule SMARTSIG:1 Capsule(s) By Mouth PRN 12/06/20   [provider]  sucralfate (CARAFATE) 1 g tablet Take 1 tablet (1 g total) by  mouth 4 (four) times daily -  with meals and at bedtime. Patient not taking: No sig reported 06/20/18   Mardella Layman, MD      Allergies    Patient has no known allergies.    Review of Systems   Review of Systems  All other systems reviewed and are negative.   Physical Exam Updated Vital Signs BP (!) 123/96 Comment: 126/93  Pulse (!) 53   Temp 98.1 F (36.7 C) (Oral)   Resp 17   SpO2 100%  Physical Exam Vitals and nursing note reviewed.  Constitutional:      General: He is not in acute distress.    Appearance: Normal appearance. He is normal weight. He is not ill-appearing, toxic-appearing or diaphoretic.  HENT:     Head: Normocephalic and atraumatic.  Eyes:     Extraocular Movements: Extraocular movements intact.     Pupils: Pupils are equal, round, and reactive to light.  Cardiovascular:     Rate and Rhythm: Normal rate and regular rhythm.     Pulses: Normal pulses.          Dorsalis pedis pulses are 2+ on the right side and 2+ on the left side.       Posterior tibial pulses are 2+ on the right side and 2+ on the left side.     Heart sounds: Normal heart sounds.  Pulmonary:     Effort: Pulmonary effort is normal. No respiratory distress.     Breath sounds: Normal breath sounds.  Chest:     Comments: Mild tenderness to palpation over the left posterior ribcage. No overlying skin changes or crepitus. Abdominal:     General: Abdomen is flat.     Palpations: Abdomen is soft.  Musculoskeletal:        General: No tenderness. Normal range of motion.     Cervical back: Normal range of motion and neck supple.     Right lower leg: No edema.     Left lower leg: No edema.     Comments: No tenderness to palpation over the cervical, thoracic, or lumbar spine.  Skin:    General: Skin is warm and dry.  Neurological:     General: No focal deficit present.     Mental Status: He is alert.  Psychiatric:        Mood and Affect: Mood normal.        Behavior: Behavior normal.      ED Results / Procedures / Treatments   Labs (all labs ordered are listed, but only abnormal results are displayed) Labs Reviewed - No data to display  EKG None  Radiology DG Ribs Unilateral W/Chest Left  Result Date: 04/22/2022 CLINICAL DATA:  Fall, LEFT-sided rib pain. EXAM: LEFT RIBS AND CHEST - 3+ VIEW COMPARISON:  Chest x-ray dated 11/16/2018. FINDINGS: Heart size and mediastinal contours are within normal limits. Lungs are clear. No pleural effusion or pneumothorax is seen. Osseous structures about the chest are unremarkable. No LEFT-sided rib fracture or displacement is seen. IMPRESSION: Negative. Lungs are clear. No rib fracture or displacement seen. Electronically Signed   By: Bary Richard M.D.   On: 04/22/2022 10:41    Procedures Procedures    Medications Ordered in ED Medications  lidocaine (LIDODERM) 5 % 1 patch (has no administration in time range)    ED Course/ Medical Decision Making/ A&P                           Medical Decision Making Amount and/or Complexity of Data Reviewed Radiology: ordered.   Patient presents today with complaints of fall.  He is afebrile, nontoxic-appearing, and in no acute distress with reassuring vital signs.  He is only endorsing pain to his left posterior rib cage.  X-ray imaging obtained of same which was reassuring for acute findings.  I have personally reviewed and interpreted this imaging and agree with radiology interpretation.  Patient without additional signs of head or spine injury.  He has no tenderness to same.  He is ambulatory without difficulty and with steady gait.  Patient given a lidocaine patch with symptomatic relief.  After this, patient states he feels ready to be discharged.  He has been educated on red flag symptoms that would prompt immediate return.  Patient is understanding and amenable with plan, discharged in stable condition.  Final Clinical Impression(s) / ED Diagnoses Final diagnoses:  Fall (on) (from)  other stairs and steps, initial encounter    Rx / DC Orders ED Discharge Orders     None     An After Visit Summary was printed and given to the patient.     Vear Clock 04/22/22 1113    Glynn Octave, MD 04/22/22 1215

## 2022-04-22 NOTE — ED Notes (Signed)
Discharge instructions reviewed with patient. Patient denies any questions or concerns. Pt ambulatory out of ED.  

## 2022-04-22 NOTE — ED Triage Notes (Signed)
Patient had a slip and fall this am sliding down 15-20 steps on buttocks. Complains of lower back pain. Pain with ROM. No loc, ambulatory

## 2023-09-05 ENCOUNTER — Ambulatory Visit: Payer: No Typology Code available for payment source | Admitting: Podiatry

## 2023-10-18 ENCOUNTER — Emergency Department (HOSPITAL_COMMUNITY)
Admission: EM | Admit: 2023-10-18 | Discharge: 2023-10-18 | Disposition: A | Discharge status: Home / Self Care | Attending: Emergency Medicine | Admitting: Emergency Medicine

## 2023-10-18 ENCOUNTER — Other Ambulatory Visit: Payer: Self-pay

## 2023-10-18 DIAGNOSIS — K0889 Other specified disorders of teeth and supporting structures: Secondary | ICD-10-CM

## 2023-10-18 DIAGNOSIS — K1120 Sialoadenitis, unspecified: Secondary | ICD-10-CM

## 2023-10-18 DIAGNOSIS — R6884 Jaw pain: Secondary | ICD-10-CM | POA: Diagnosis present

## 2023-10-18 MED ORDER — AMOXICILLIN-POT CLAVULANATE 875-125 MG PO TABS: 1.0000 | ORAL_TABLET | Freq: Two times a day (BID) | ORAL | 0 refills | Status: AC

## 2023-10-18 NOTE — ED Provider Notes (Signed)
 [place] EMERGENCY DEPARTMENT AT [facility] Provider Note   CSN: [id number] Arrival date & time: 10/18/23  0865     History Chief Complaint  Patient presents with   Abscess   Jaw Pain    HPI Randy Campbell is a 48 y.o. male presenting for chief complaint of right-sided jaw pain. States that he has felt a swelling on the right side of his cheek for the past 5 days acutely worsening over the last 48 hours.  Denies fevers chills nausea vomiting shortness breath.  History of poor dentition but states that his teeth have not been bothering him with this episode. States that he feels that it is "his salivary glands".   Patient's recorded medical, surgical, social, medication list and allergies were reviewed in the Snapshot window as part of the initial history.   Review of Systems   Review of Systems  Constitutional:  Negative for chills and fever.  HENT:  Positive for facial swelling. Negative for ear pain and sore throat.   Eyes:  Negative for pain and visual disturbance.  Respiratory:  Negative for cough and shortness of breath.   Cardiovascular:  Negative for chest pain and palpitations.  Gastrointestinal:  Negative for abdominal pain and vomiting.  Genitourinary:  Negative for dysuria and hematuria.  Musculoskeletal:  Negative for arthralgias and back pain.  Skin:  Negative for color change and rash.  Neurological:  Negative for seizures and syncope.  All other systems reviewed and are negative.   Physical Exam Updated Vital Signs BP (!) 130/91 (BP Location: Right Arm)   Pulse 87   Temp 99.2 F (37.3 C)   Resp 16   Ht 6\' 1"  (1.854 m)   Wt 107 kg   SpO2 94%   BMI 31.14 kg/m  Physical Exam Vitals and nursing note reviewed.  Constitutional:      General: He is not in acute distress.    Appearance: He is well-developed.  HENT:     Head: Normocephalic and atraumatic.  Eyes:     Conjunctiva/sclera: Conjunctivae normal.  Cardiovascular:     Rate and  Rhythm: Normal rate and regular rhythm.  Pulmonary:     Effort: Pulmonary effort is normal. No respiratory distress.  Abdominal:     General: Abdomen is flat. There is no distension.  Musculoskeletal:        General: No swelling or deformity.  Skin:    General: Skin is warm and dry.     Capillary Refill: Capillary refill takes less than 2 seconds.  Neurological:     Mental Status: He is alert and oriented to person, place, and time. Mental status is at baseline.      ED Course/ Medical Decision Making/ A&P    Procedures Procedures   Medications Ordered in ED Medications - No data to display  Medical Decision Making:   Patient's history of present illness physicals and findings are most consistent with sialoadenitis. Will start on Augmentin given duration of symptoms, referred to primary care for reassessment within 48 hours. Supportive care reinforced and patient expressed understanding.  No evidence of Ludwig angina, deep space infection.  No evidence of orbital cellulitis or other soft tissue infection of the face.  Disposition:  I have considered need for hospitalization, however, considering all of the above, I believe this patient is stable for discharge at this time.  Patient/family educated about specific return precautions for given chief complaint and symptoms.  Patient/family educated about follow-up with PCP.  Patient/family expressed understanding of return precautions and need for follow-up. Patient spoken to regarding all imaging and laboratory results and appropriate follow up for these results. All education provided in verbal form with additional information in written form. Time was allowed for answering of patient questions. Patient discharged.    Emergency Department Medication Summary:   Medications - No data to display      Clinical Impression:  1. Sialadenitis   2. Pain, dental      Discharge   Final Clinical Impression(s) / ED  Diagnoses Final diagnoses:  Sialadenitis  Pain, dental    Rx / DC Orders ED Discharge Orders          Ordered    amoxicillin-clavulanate (AUGMENTIN) 875-125 MG tablet  Every 12 hours        10/18/23 1301              Glyn Ade, MD 10/18/23 1301

## 2023-10-18 NOTE — ED Triage Notes (Signed)
 Pt. Stated, Ive developed a knot on my right side of jaw. This started a week ago

## 2023-10-18 NOTE — ED Notes (Signed)
 Awaiting patient from lobby.
